# Patient Record
Sex: Female | Born: 1965 | Race: White | Hispanic: No | Marital: Married | Smoking: Current every day smoker
Health system: Southern US, Community
[De-identification: ages and names within clinical notes are randomized; demographics above are authoritative.]

## PROBLEM LIST (undated history)

## (undated) DIAGNOSIS — F419 Anxiety disorder, unspecified: Secondary | ICD-10-CM

## (undated) DIAGNOSIS — M329 Systemic lupus erythematosus, unspecified: Secondary | ICD-10-CM

## (undated) DIAGNOSIS — E785 Hyperlipidemia, unspecified: Secondary | ICD-10-CM

## (undated) DIAGNOSIS — E079 Disorder of thyroid, unspecified: Secondary | ICD-10-CM

## (undated) HISTORY — DX: Disorder of thyroid, unspecified: E07.9

## (undated) HISTORY — PX: ABLATION: SHX5711

## (undated) HISTORY — DX: Systemic lupus erythematosus, unspecified: M32.9

## (undated) HISTORY — DX: Anxiety disorder, unspecified: F41.9

## (undated) HISTORY — DX: Hyperlipidemia, unspecified: E78.5

---

## 1997-10-04 ENCOUNTER — Ambulatory Visit (HOSPITAL_COMMUNITY): Admission: RE | Admit: 1997-10-04 | Discharge: 1997-10-04 | Payer: Self-pay | Admitting: Obstetrics and Gynecology

## 1999-10-23 ENCOUNTER — Ambulatory Visit (HOSPITAL_COMMUNITY): Admission: RE | Admit: 1999-10-23 | Discharge: 1999-10-23 | Payer: Self-pay | Admitting: Family Medicine

## 1999-12-18 ENCOUNTER — Other Ambulatory Visit: Admission: RE | Admit: 1999-12-18 | Discharge: 1999-12-18 | Payer: Self-pay | Admitting: Obstetrics and Gynecology

## 2003-06-01 ENCOUNTER — Encounter: Admission: RE | Admit: 2003-06-01 | Discharge: 2003-06-01 | Payer: Self-pay | Admitting: Internal Medicine

## 2005-06-01 ENCOUNTER — Other Ambulatory Visit: Admission: RE | Admit: 2005-06-01 | Discharge: 2005-06-01 | Payer: Self-pay | Admitting: Obstetrics and Gynecology

## 2005-07-11 ENCOUNTER — Encounter: Admission: RE | Admit: 2005-07-11 | Discharge: 2005-07-11 | Payer: Self-pay | Admitting: Internal Medicine

## 2006-12-21 ENCOUNTER — Emergency Department (HOSPITAL_COMMUNITY): Admission: EM | Admit: 2006-12-21 | Discharge: 2006-12-21 | Payer: Self-pay | Admitting: Emergency Medicine

## 2007-09-26 ENCOUNTER — Other Ambulatory Visit: Admission: RE | Admit: 2007-09-26 | Discharge: 2007-09-26 | Payer: Self-pay | Admitting: Obstetrics and Gynecology

## 2007-10-08 ENCOUNTER — Encounter: Admission: RE | Admit: 2007-10-08 | Discharge: 2007-10-08 | Payer: Self-pay | Admitting: Obstetrics and Gynecology

## 2009-08-16 ENCOUNTER — Other Ambulatory Visit: Admission: RE | Admit: 2009-08-16 | Discharge: 2009-08-16 | Payer: Self-pay | Admitting: Obstetrics and Gynecology

## 2009-09-02 ENCOUNTER — Encounter: Admission: RE | Admit: 2009-09-02 | Discharge: 2009-09-02 | Payer: Self-pay | Admitting: Obstetrics and Gynecology

## 2011-01-10 ENCOUNTER — Other Ambulatory Visit (HOSPITAL_COMMUNITY)
Admission: RE | Admit: 2011-01-10 | Discharge: 2011-01-10 | Disposition: A | Discharge status: Home / Self Care | Payer: PRIVATE HEALTH INSURANCE | Source: Ambulatory Visit | Attending: Obstetrics and Gynecology | Admitting: Obstetrics and Gynecology

## 2011-01-10 ENCOUNTER — Other Ambulatory Visit: Payer: Self-pay | Admitting: Obstetrics and Gynecology

## 2011-01-10 DIAGNOSIS — Z01419 Encounter for gynecological examination (general) (routine) without abnormal findings: Secondary | ICD-10-CM | POA: Insufficient documentation

## 2011-04-18 LAB — URINALYSIS, ROUTINE W REFLEX MICROSCOPIC
Bilirubin Urine: NEGATIVE
Glucose, UA: NEGATIVE
Ketones, ur: NEGATIVE
Leukocytes, UA: NEGATIVE
Nitrite: NEGATIVE
Protein, ur: NEGATIVE
Specific Gravity, Urine: >1.03 — ABNORMAL HIGH
Urobilinogen, UA: 0.2
pH: 5.5

## 2011-04-18 LAB — CBC
HCT: 41.1
Hemoglobin: 14.6
MCHC: 35.4
MCV: 92.6
Platelets: 294
RBC: 4.43
RDW: 12.8
WBC: 13.6 — ABNORMAL HIGH

## 2011-04-18 LAB — COMPREHENSIVE METABOLIC PANEL
ALT: 24
AST: 29
Albumin: 3.8
Alkaline Phosphatase: 78
BUN: 16
CO2: 32
Calcium: 8.6
Chloride: 104
Creatinine, Ser: 0.66
GFR calc Af Amer: >60
GFR calc non Af Amer: >60
Glucose, Bld: 115 — ABNORMAL HIGH
Potassium: 3.3 — ABNORMAL LOW
Sodium: 136
Total Bilirubin: 0.5
Total Protein: 6.6

## 2011-04-18 LAB — DIFFERENTIAL
Basophils Absolute: 0
Basophils Relative: 0
Eosinophils Absolute: 0
Eosinophils Relative: 0
Lymphocytes Relative: 4 — ABNORMAL LOW
Lymphs Abs: 0.5 — ABNORMAL LOW
Monocytes Absolute: 0.2
Monocytes Relative: 2 — ABNORMAL LOW
Neutro Abs: 12.9 — ABNORMAL HIGH
Neutrophils Relative %: 95 — ABNORMAL HIGH

## 2011-04-18 LAB — LIPASE, BLOOD: Lipase: 12

## 2011-04-18 LAB — URINE CULTURE: Colony Count: 2000

## 2011-04-18 LAB — URINE MICROSCOPIC-ADD ON

## 2012-02-05 ENCOUNTER — Other Ambulatory Visit (HOSPITAL_COMMUNITY)
Admission: RE | Admit: 2012-02-05 | Discharge: 2012-02-05 | Disposition: A | Discharge status: Home / Self Care | Payer: PRIVATE HEALTH INSURANCE | Source: Ambulatory Visit | Attending: Obstetrics and Gynecology | Admitting: Obstetrics and Gynecology

## 2012-02-05 DIAGNOSIS — Z1151 Encounter for screening for human papillomavirus (HPV): Secondary | ICD-10-CM | POA: Insufficient documentation

## 2012-02-05 DIAGNOSIS — Z01419 Encounter for gynecological examination (general) (routine) without abnormal findings: Secondary | ICD-10-CM | POA: Insufficient documentation

## 2013-12-17 ENCOUNTER — Other Ambulatory Visit: Payer: Self-pay | Admitting: Internal Medicine

## 2013-12-17 DIAGNOSIS — Z1231 Encounter for screening mammogram for malignant neoplasm of breast: Secondary | ICD-10-CM

## 2013-12-21 ENCOUNTER — Ambulatory Visit: Payer: PRIVATE HEALTH INSURANCE

## 2014-01-19 ENCOUNTER — Ambulatory Visit
Admission: RE | Admit: 2014-01-19 | Discharge: 2014-01-19 | Disposition: A | Discharge status: Home / Self Care | Payer: BC Managed Care – PPO | Source: Ambulatory Visit | Attending: Internal Medicine | Admitting: Internal Medicine

## 2014-01-19 ENCOUNTER — Encounter (INDEPENDENT_AMBULATORY_CARE_PROVIDER_SITE_OTHER): Payer: Self-pay

## 2014-01-19 DIAGNOSIS — Z1231 Encounter for screening mammogram for malignant neoplasm of breast: Secondary | ICD-10-CM

## 2014-06-18 ENCOUNTER — Other Ambulatory Visit: Payer: Self-pay | Admitting: Internal Medicine

## 2014-06-18 DIAGNOSIS — R319 Hematuria, unspecified: Secondary | ICD-10-CM

## 2014-06-23 ENCOUNTER — Ambulatory Visit
Admission: RE | Admit: 2014-06-23 | Discharge: 2014-06-23 | Disposition: A | Discharge status: Home / Self Care | Payer: BC Managed Care – PPO | Source: Ambulatory Visit | Attending: Internal Medicine | Admitting: Internal Medicine

## 2014-06-23 DIAGNOSIS — R319 Hematuria, unspecified: Secondary | ICD-10-CM

## 2015-06-23 ENCOUNTER — Encounter: Payer: Self-pay | Admitting: Gastroenterology

## 2015-08-10 ENCOUNTER — Ambulatory Visit (AMBULATORY_SURGERY_CENTER): Payer: Self-pay | Admitting: *Deleted

## 2015-08-10 VITALS — Ht <= 58 in | Wt 135.0 lb

## 2015-08-10 DIAGNOSIS — Z1211 Encounter for screening for malignant neoplasm of colon: Secondary | ICD-10-CM

## 2015-08-10 MED ORDER — NA SULFATE-K SULFATE-MG SULF 17.5-3.13-1.6 GM/177ML PO SOLN: ORAL | Status: DC

## 2015-08-10 NOTE — Progress Notes (Signed)
Patient denies any allergies to eggs or soy. Patient denies any problems with anesthesia/sedation. Patient denies any oxygen use at home and does not take any diet/weight loss medications. EMMI education assisgned to patient on colonoscopy, this was explained and instructions given to patient. 

## 2015-08-24 ENCOUNTER — Encounter: Payer: Self-pay | Admitting: Gastroenterology

## 2015-08-24 ENCOUNTER — Ambulatory Visit (AMBULATORY_SURGERY_CENTER): Payer: BLUE CROSS/BLUE SHIELD | Admitting: Gastroenterology

## 2015-08-24 VITALS — BP 106/61 | HR 70 | Temp 99.1°F | Resp 13 | Ht <= 58 in | Wt 135.0 lb

## 2015-08-24 DIAGNOSIS — Z1211 Encounter for screening for malignant neoplasm of colon: Secondary | ICD-10-CM

## 2015-08-24 MED ORDER — SODIUM CHLORIDE 0.9 % IV SOLN: 500.0000 mL | INTRAVENOUS | Status: DC

## 2015-08-24 NOTE — Progress Notes (Signed)
Report to PACU, RN, vss, BBS= Clear.  

## 2015-08-24 NOTE — Op Note (Signed)
[facility]  [address] [place], [address]   COLONOSCOPY PROCEDURE REPORT  PATIENT: Angel Young  MR#: [id number] BIRTHDATE: 1965/08/01 , 49  yrs. old GENDER: female ENDOSCOPIST: Wilfrid Lund, MD REFERRED AY:[id number] Virgina Jock, M.D. PROCEDURE DATE:  08/24/2015 PROCEDURE:   Colonoscopy, screening First Screening Colonoscopy - Avg.  risk and is 50 yrs.  old or older Yes.  Prior Negative Screening - Now for repeat screening. N/A  History of Adenoma - Now for follow-up colonoscopy & has been > or = to 3 yrs.  N/A  Polyps removed today? No ASA CLASS:   Class II INDICATIONS:average risk patient for colon cancer. MEDICATIONS: Monitored anesthesia care and Propofol 200 mg IV  DESCRIPTION OF PROCEDURE:   After the risks benefits and alternatives of the procedure were thoroughly explained, informed consent was obtained.  The digital rectal exam revealed no abnormalities of the rectum.   The LB [id number]  endoscope was introduced through the anus and advanced to the cecum, which was identified by both the appendix and ileocecal valve. No adverse events experienced.   The quality of the prep was excellent. (Suprep was used)  The instrument was then slowly withdrawn as the colon was fully examined. Estimated blood loss is zero unless otherwise noted in this procedure report.      COLON FINDINGS: There was mild diverticulosis noted in the sigmoid colon and ascending colon.   Moderate sized Grade I hemorrhoids were found.  Retroflexed views revealed internal Grade I hemorrhoids. The time to cecum = 4.6 Withdrawal time = 10.1   The scope was withdrawn and the procedure completed. COMPLICATIONS: There were no immediate complications.  ENDOSCOPIC IMPRESSION: 1.   Mild diverticulosis was noted in the sigmoid colon and ascending colon 2.   Moderate sized Grade I hemorrhoids  RECOMMENDATIONS: Recall for screening colonoscopy in 10 years.  eSigned:  Wilfrid Lund, MD  08/24/2015 10:04 AM   cc:

## 2015-08-24 NOTE — Patient Instructions (Signed)
Discharge instructions given. Handouts on diverticulosis and hemorrhoids. Resume previous medications. YOU HAD AN ENDOSCOPIC PROCEDURE TODAY AT THE [facility]:   Refer to the procedure report that was given to you for any specific questions about what was found during the examination.  If the procedure report does not answer your questions, please call your gastroenterologist to clarify.  If you requested that your care partner not be given the details of your procedure findings, then the procedure report has been included in a sealed envelope for you to review at your convenience later.  YOU SHOULD EXPECT: Some feelings of bloating in the abdomen. Passage of more gas than usual.  Walking can help get rid of the air that was put into your GI tract during the procedure and reduce the bloating. If you had a lower endoscopy (such as a colonoscopy or flexible sigmoidoscopy) you may notice spotting of blood in your stool or on the toilet paper. If you underwent a bowel prep for your procedure, you may not have a normal bowel movement for a few days.  Please Note:  You might notice some irritation and congestion in your nose or some drainage.  This is from the oxygen used during your procedure.  There is no need for concern and it should clear up in a day or so.  SYMPTOMS TO REPORT IMMEDIATELY:   Following lower endoscopy (colonoscopy or flexible sigmoidoscopy):  Excessive amounts of blood in the stool  Significant tenderness or worsening of abdominal pains  Swelling of the abdomen that is new, acute  Fever of 100F or higher   For urgent or emergent issues, a gastroenterologist can be reached at any hour by calling [phone].   DIET: Your first meal following the procedure should be a small meal and then it is ok to progress to your normal diet. Heavy or fried foods are harder to digest and may make you feel nauseous or bloated.  Likewise, meals heavy in dairy and vegetables can  increase bloating.  Drink plenty of fluids but you should avoid alcoholic beverages for 24 hours.  ACTIVITY:  You should plan to take it easy for the rest of today and you should NOT DRIVE or use heavy machinery until tomorrow (because of the sedation medicines used during the test).    FOLLOW UP: Our staff will call the number listed on your records the next business day following your procedure to check on you and address any questions or concerns that you may have regarding the information given to you following your procedure. If we do not reach you, we will leave a message.  However, if you are feeling well and you are not experiencing any problems, there is no need to return our call.  We will assume that you have returned to your regular daily activities without incident.  If any biopsies were taken you will be contacted by phone or by letter within the next 1-3 weeks.  Please call us at [phone] if you have not heard about the biopsies in 3 weeks.    SIGNATURES/CONFIDENTIALITY: You and/or your care partner have signed paperwork which will be entered into your electronic medical record.  These signatures attest to the fact that that the information above on your After Visit Summary has been reviewed and is understood.  Full responsibility of the confidentiality of this discharge information lies with you and/or your care-partner. 

## 2015-08-25 ENCOUNTER — Telehealth: Payer: Self-pay | Admitting: *Deleted

## 2015-08-25 NOTE — Telephone Encounter (Signed)
  Follow up Call-  Call back number 08/24/2015  Post procedure Call Back phone  # [phone]  Permission to leave phone message Yes     Patient questions:  Do you have a fever, pain , or abdominal swelling? No. Pain Score  0 *  Have you tolerated food without any problems? Yes.    Have you been able to return to your normal activities? Yes.    Do you have any questions about your discharge instructions: Diet   No. Medications  No. Follow up visit  No.  Do you have questions or concerns about your Care? No.  Actions: * If pain score is 4 or above: No action needed, pain <4.

## 2016-01-26 ENCOUNTER — Other Ambulatory Visit (HOSPITAL_COMMUNITY)
Admission: RE | Admit: 2016-01-26 | Discharge: 2016-01-26 | Disposition: A | Discharge status: Home / Self Care | Payer: BLUE CROSS/BLUE SHIELD | Source: Ambulatory Visit | Attending: Obstetrics and Gynecology | Admitting: Obstetrics and Gynecology

## 2016-01-26 ENCOUNTER — Other Ambulatory Visit: Payer: Self-pay | Admitting: Obstetrics and Gynecology

## 2016-01-26 DIAGNOSIS — Z01419 Encounter for gynecological examination (general) (routine) without abnormal findings: Secondary | ICD-10-CM | POA: Insufficient documentation

## 2016-01-26 DIAGNOSIS — Z1151 Encounter for screening for human papillomavirus (HPV): Secondary | ICD-10-CM | POA: Insufficient documentation

## 2016-02-01 LAB — CYTOLOGY - PAP

## 2016-10-08 ENCOUNTER — Other Ambulatory Visit: Payer: Self-pay | Admitting: Internal Medicine

## 2016-10-08 DIAGNOSIS — Z1231 Encounter for screening mammogram for malignant neoplasm of breast: Secondary | ICD-10-CM

## 2016-11-09 ENCOUNTER — Ambulatory Visit
Admission: RE | Admit: 2016-11-09 | Discharge: 2016-11-09 | Disposition: A | Discharge status: Home / Self Care | Payer: BLUE CROSS/BLUE SHIELD | Source: Ambulatory Visit | Attending: Internal Medicine | Admitting: Internal Medicine

## 2016-11-09 DIAGNOSIS — Z1231 Encounter for screening mammogram for malignant neoplasm of breast: Secondary | ICD-10-CM

## 2017-08-15 DIAGNOSIS — M180 Bilateral primary osteoarthritis of first carpometacarpal joints: Secondary | ICD-10-CM | POA: Diagnosis not present

## 2017-08-15 DIAGNOSIS — L930 Discoid lupus erythematosus: Secondary | ICD-10-CM | POA: Diagnosis not present

## 2017-08-15 DIAGNOSIS — M79645 Pain in left finger(s): Secondary | ICD-10-CM | POA: Diagnosis not present

## 2017-08-21 DIAGNOSIS — R05 Cough: Secondary | ICD-10-CM | POA: Diagnosis not present

## 2017-08-21 DIAGNOSIS — R509 Fever, unspecified: Secondary | ICD-10-CM | POA: Diagnosis not present

## 2017-08-21 DIAGNOSIS — R52 Pain, unspecified: Secondary | ICD-10-CM | POA: Diagnosis not present

## 2017-09-11 DIAGNOSIS — L720 Epidermal cyst: Secondary | ICD-10-CM | POA: Diagnosis not present

## 2017-09-11 DIAGNOSIS — D225 Melanocytic nevi of trunk: Secondary | ICD-10-CM | POA: Diagnosis not present

## 2017-09-11 DIAGNOSIS — D2361 Other benign neoplasm of skin of right upper limb, including shoulder: Secondary | ICD-10-CM | POA: Diagnosis not present

## 2017-09-11 DIAGNOSIS — D482 Neoplasm of uncertain behavior of peripheral nerves and autonomic nervous system: Secondary | ICD-10-CM | POA: Diagnosis not present

## 2017-09-11 DIAGNOSIS — D229 Melanocytic nevi, unspecified: Secondary | ICD-10-CM | POA: Diagnosis not present

## 2017-10-03 DIAGNOSIS — L720 Epidermal cyst: Secondary | ICD-10-CM | POA: Diagnosis not present

## 2017-12-23 DIAGNOSIS — R7309 Other abnormal glucose: Secondary | ICD-10-CM | POA: Diagnosis not present

## 2017-12-23 DIAGNOSIS — E038 Other specified hypothyroidism: Secondary | ICD-10-CM | POA: Diagnosis not present

## 2017-12-23 DIAGNOSIS — I700 Atherosclerosis of aorta: Secondary | ICD-10-CM | POA: Diagnosis not present

## 2017-12-23 DIAGNOSIS — E7849 Other hyperlipidemia: Secondary | ICD-10-CM | POA: Diagnosis not present

## 2017-12-23 DIAGNOSIS — H01129 Discoid lupus erythematosus of unspecified eye, unspecified eyelid: Secondary | ICD-10-CM | POA: Diagnosis not present

## 2018-05-09 DIAGNOSIS — N951 Menopausal and female climacteric states: Secondary | ICD-10-CM | POA: Diagnosis not present

## 2018-05-09 DIAGNOSIS — Z01419 Encounter for gynecological examination (general) (routine) without abnormal findings: Secondary | ICD-10-CM | POA: Diagnosis not present

## 2018-06-27 DIAGNOSIS — E038 Other specified hypothyroidism: Secondary | ICD-10-CM | POA: Diagnosis not present

## 2018-06-27 DIAGNOSIS — R82998 Other abnormal findings in urine: Secondary | ICD-10-CM | POA: Diagnosis not present

## 2018-06-27 DIAGNOSIS — Z0000 Encounter for general adult medical examination without abnormal findings: Secondary | ICD-10-CM | POA: Diagnosis not present

## 2018-06-27 DIAGNOSIS — R7309 Other abnormal glucose: Secondary | ICD-10-CM | POA: Diagnosis not present

## 2018-07-04 ENCOUNTER — Other Ambulatory Visit: Payer: Self-pay | Admitting: Internal Medicine

## 2018-07-04 DIAGNOSIS — Z0000 Encounter for general adult medical examination without abnormal findings: Secondary | ICD-10-CM | POA: Diagnosis not present

## 2018-07-04 DIAGNOSIS — E785 Hyperlipidemia, unspecified: Secondary | ICD-10-CM

## 2018-07-04 DIAGNOSIS — H01129 Discoid lupus erythematosus of unspecified eye, unspecified eyelid: Secondary | ICD-10-CM | POA: Diagnosis not present

## 2018-07-04 DIAGNOSIS — Z1389 Encounter for screening for other disorder: Secondary | ICD-10-CM | POA: Diagnosis not present

## 2018-07-04 DIAGNOSIS — R7309 Other abnormal glucose: Secondary | ICD-10-CM | POA: Diagnosis not present

## 2018-07-04 DIAGNOSIS — I700 Atherosclerosis of aorta: Secondary | ICD-10-CM | POA: Diagnosis not present

## 2018-07-07 ENCOUNTER — Ambulatory Visit
Admission: RE | Admit: 2018-07-07 | Discharge: 2018-07-07 | Disposition: A | Discharge status: Home / Self Care | Payer: No Typology Code available for payment source | Source: Ambulatory Visit | Attending: Internal Medicine | Admitting: Internal Medicine

## 2018-07-07 DIAGNOSIS — E785 Hyperlipidemia, unspecified: Secondary | ICD-10-CM

## 2018-07-17 ENCOUNTER — Other Ambulatory Visit: Payer: Self-pay | Admitting: Internal Medicine

## 2018-07-17 DIAGNOSIS — Z1231 Encounter for screening mammogram for malignant neoplasm of breast: Secondary | ICD-10-CM

## 2018-08-20 ENCOUNTER — Ambulatory Visit
Admission: RE | Admit: 2018-08-20 | Discharge: 2018-08-20 | Disposition: A | Discharge status: Home / Self Care | Payer: BLUE CROSS/BLUE SHIELD | Source: Ambulatory Visit | Attending: Internal Medicine | Admitting: Internal Medicine

## 2018-08-20 DIAGNOSIS — Z1231 Encounter for screening mammogram for malignant neoplasm of breast: Secondary | ICD-10-CM

## 2018-08-22 ENCOUNTER — Other Ambulatory Visit: Payer: Self-pay | Admitting: Internal Medicine

## 2018-08-22 DIAGNOSIS — R928 Other abnormal and inconclusive findings on diagnostic imaging of breast: Secondary | ICD-10-CM

## 2018-08-27 ENCOUNTER — Ambulatory Visit
Admission: RE | Admit: 2018-08-27 | Discharge: 2018-08-27 | Disposition: A | Discharge status: Home / Self Care | Payer: Commercial Managed Care - PPO | Source: Ambulatory Visit | Attending: Internal Medicine | Admitting: Internal Medicine

## 2018-08-27 DIAGNOSIS — R928 Other abnormal and inconclusive findings on diagnostic imaging of breast: Secondary | ICD-10-CM

## 2018-08-27 DIAGNOSIS — N6489 Other specified disorders of breast: Secondary | ICD-10-CM | POA: Diagnosis not present

## 2018-08-27 DIAGNOSIS — R921 Mammographic calcification found on diagnostic imaging of breast: Secondary | ICD-10-CM | POA: Diagnosis not present

## 2019-06-03 ENCOUNTER — Other Ambulatory Visit (HOSPITAL_COMMUNITY)
Admission: RE | Admit: 2019-06-03 | Discharge: 2019-06-03 | Disposition: A | Discharge status: Home / Self Care | Payer: Commercial Managed Care - PPO | Source: Ambulatory Visit | Attending: Obstetrics and Gynecology | Admitting: Obstetrics and Gynecology

## 2019-06-03 ENCOUNTER — Encounter: Payer: Self-pay | Admitting: Obstetrics and Gynecology

## 2019-06-03 ENCOUNTER — Other Ambulatory Visit: Payer: Self-pay | Admitting: Obstetrics and Gynecology

## 2019-06-03 DIAGNOSIS — Z01419 Encounter for gynecological examination (general) (routine) without abnormal findings: Secondary | ICD-10-CM | POA: Diagnosis not present

## 2019-06-08 LAB — CYTOLOGY - PAP
Comment: NEGATIVE
Diagnosis: NEGATIVE
High risk HPV: NEGATIVE

## 2019-07-31 ENCOUNTER — Other Ambulatory Visit: Payer: Self-pay | Admitting: Internal Medicine

## 2019-07-31 DIAGNOSIS — Z1231 Encounter for screening mammogram for malignant neoplasm of breast: Secondary | ICD-10-CM

## 2019-09-07 ENCOUNTER — Ambulatory Visit
Admission: RE | Admit: 2019-09-07 | Discharge: 2019-09-07 | Disposition: A | Discharge status: Home / Self Care | Payer: Commercial Managed Care - PPO | Source: Ambulatory Visit | Attending: Internal Medicine | Admitting: Internal Medicine

## 2019-09-07 ENCOUNTER — Other Ambulatory Visit: Payer: Self-pay

## 2019-09-07 DIAGNOSIS — Z1231 Encounter for screening mammogram for malignant neoplasm of breast: Secondary | ICD-10-CM

## 2020-12-02 IMAGING — MG DIGITAL SCREENING BILAT W/ TOMO W/ CAD
8 series · 8 of 24 positions shown · non-contrast
Comparison: Previous exam(s).

CLINICAL DATA: Screening.

EXAM:
DIGITAL SCREENING BILATERAL MAMMOGRAM WITH TOMO AND CAD

[R MLO synth-2D]
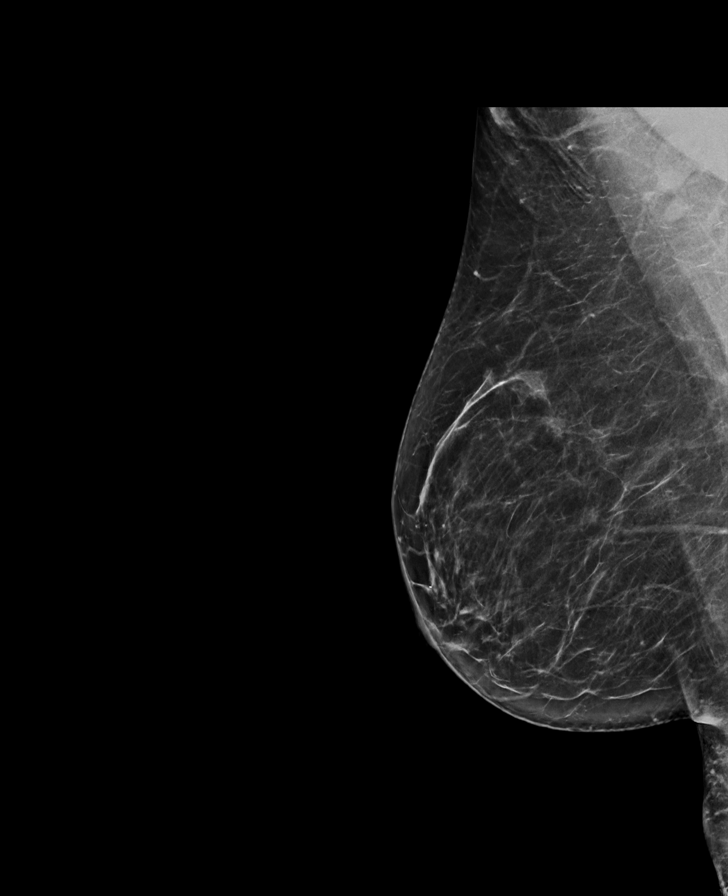

[L MLO synth-2D]
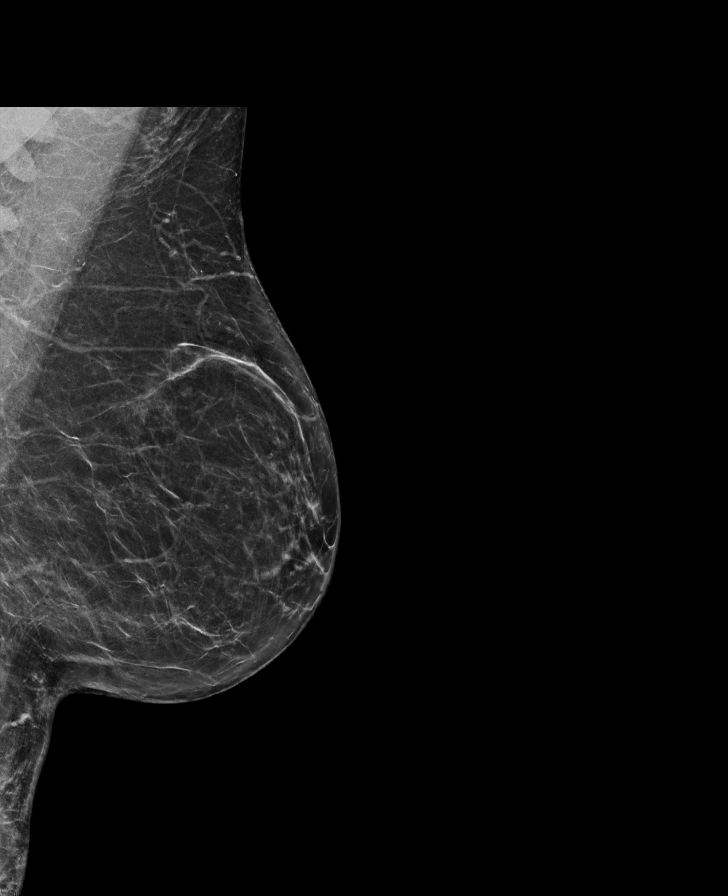

[R CC synth-2D]
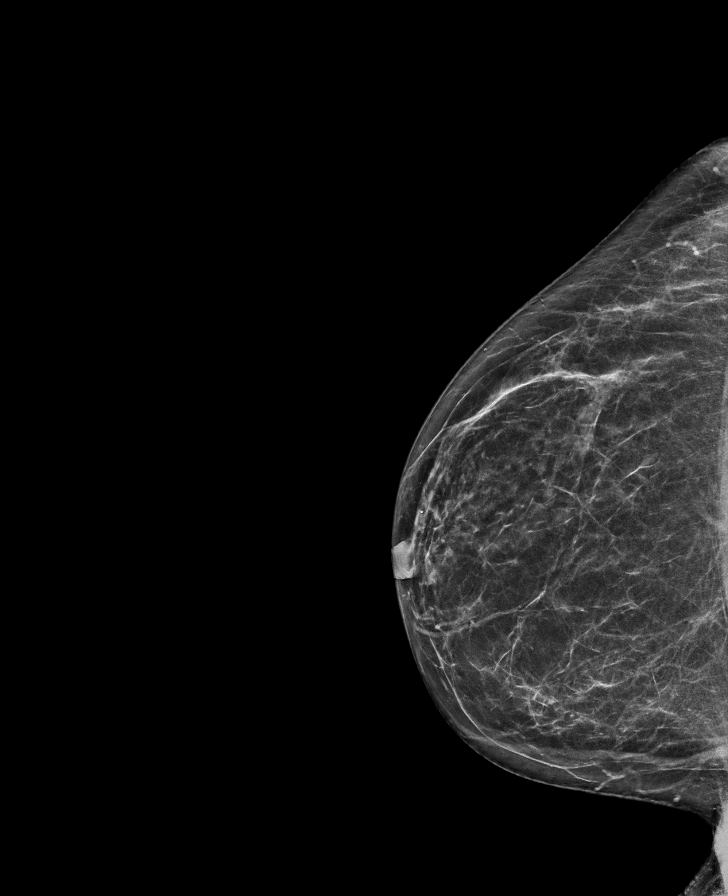

[L CC synth-2D]
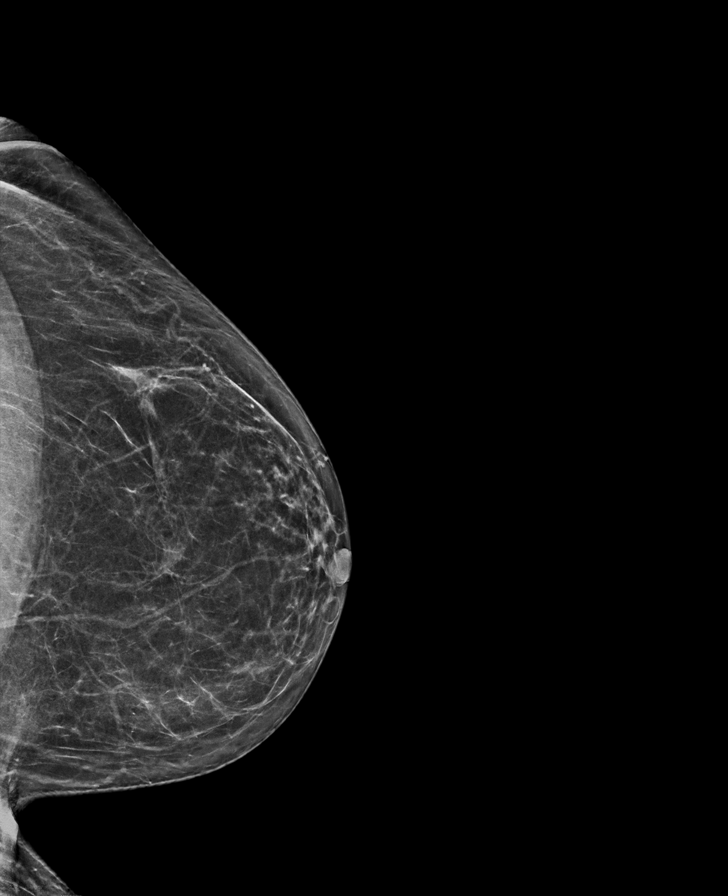

[R CC tomo · tomo slice 33/65.0]
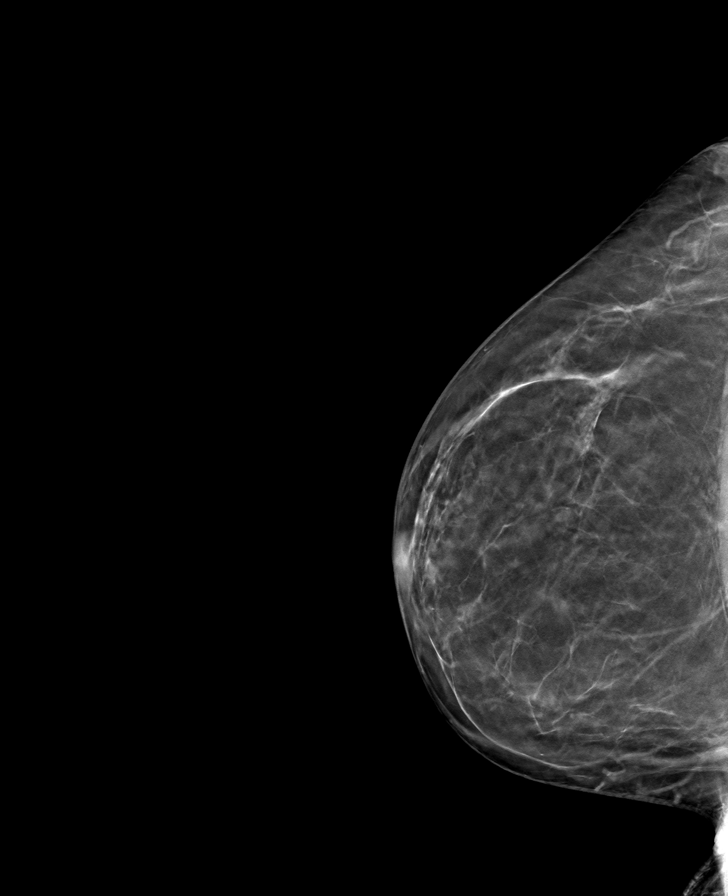

[L MLO tomo · tomo slice 36/71.0]
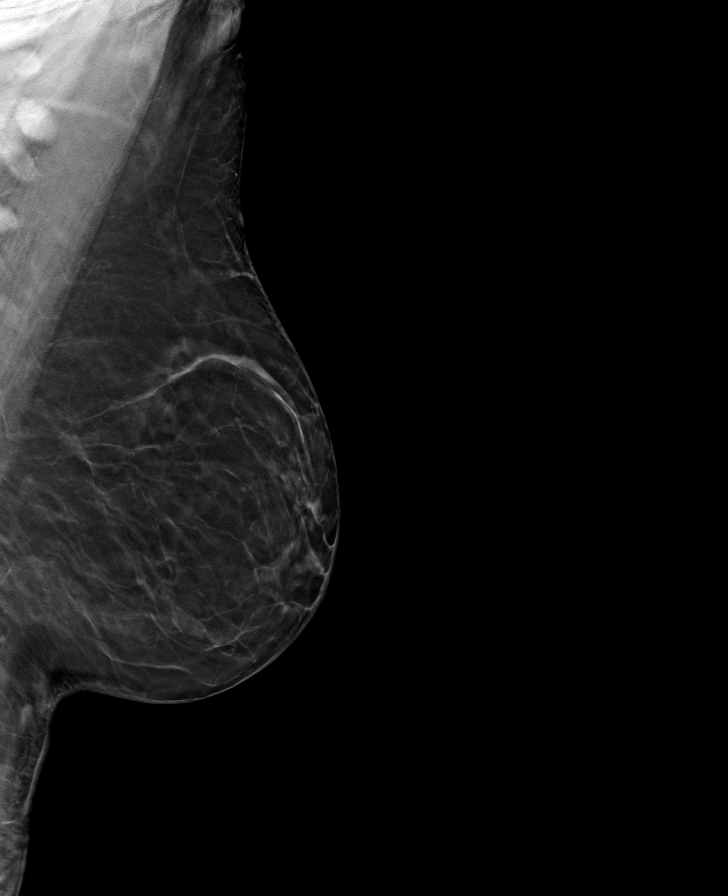

[L CC tomo · tomo slice 33/64.0]
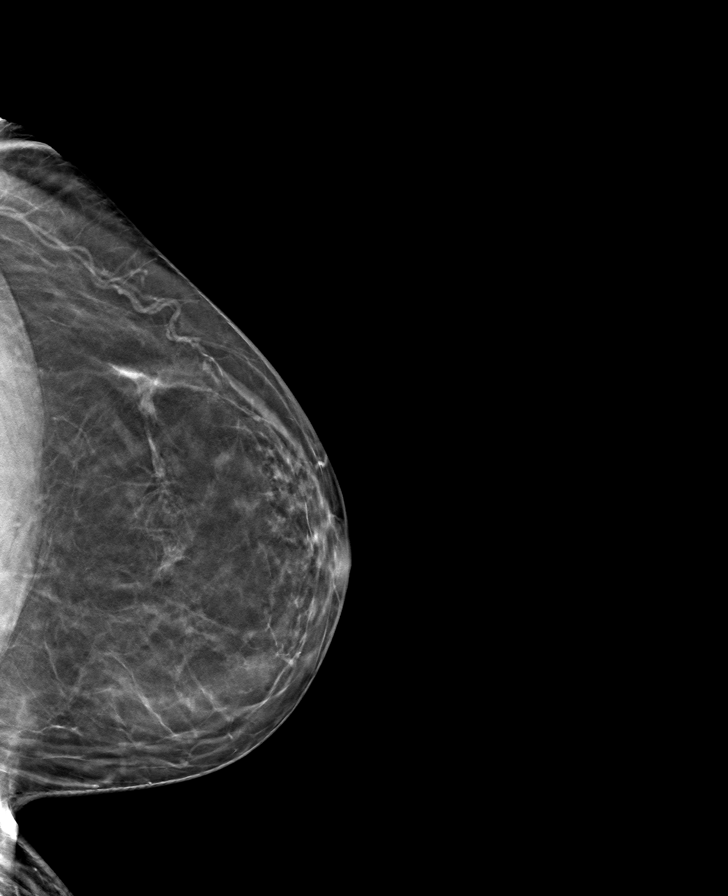

[R MLO tomo · tomo slice 39/76.0]
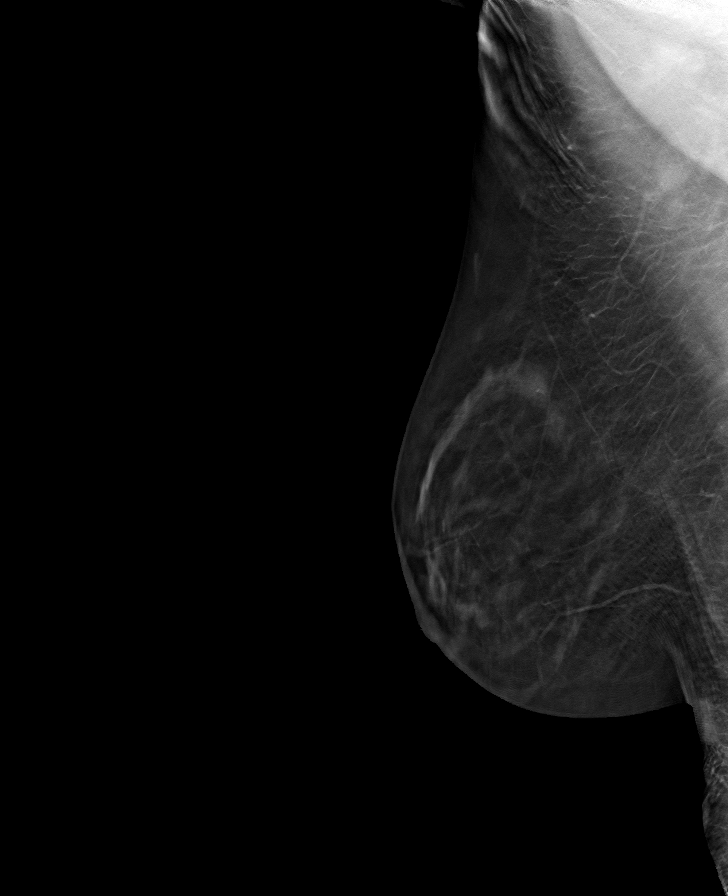

[8 of 24 positions shown; findings below may reference images not displayed]

ACR Breast Density Category b: There are scattered areas of
fibroglandular density.
FINDINGS: There are no findings suspicious for malignancy. Images were
processed with CAD.
IMPRESSION: No mammographic evidence of malignancy. A result letter of this
screening mammogram will be mailed directly to the patient.

RECOMMENDATION:
Screening mammogram in one year. (Code:[id number])

BI-RADS CATEGORY  1: Negative.

## 2021-10-25 ENCOUNTER — Ambulatory Visit (INDEPENDENT_AMBULATORY_CARE_PROVIDER_SITE_OTHER): Payer: 59 | Admitting: Gastroenterology

## 2021-10-25 ENCOUNTER — Encounter: Payer: Self-pay | Admitting: Gastroenterology

## 2021-10-25 ENCOUNTER — Other Ambulatory Visit (INDEPENDENT_AMBULATORY_CARE_PROVIDER_SITE_OTHER): Payer: 59

## 2021-10-25 VITALS — BP 162/82 | HR 82 | Ht <= 58 in | Wt 141.2 lb

## 2021-10-25 DIAGNOSIS — K5909 Other constipation: Secondary | ICD-10-CM

## 2021-10-25 DIAGNOSIS — R1013 Epigastric pain: Secondary | ICD-10-CM | POA: Diagnosis not present

## 2021-10-25 LAB — CBC WITH DIFFERENTIAL/PLATELET
Basophils Absolute: 0.1 10*3/uL (ref 0.0–0.1)
Basophils Relative: 1.1 % (ref 0.0–3.0)
Eosinophils Absolute: 0.1 10*3/uL (ref 0.0–0.7)
Eosinophils Relative: 1.3 % (ref 0.0–5.0)
HCT: 44.7 % (ref 36.0–46.0)
Hemoglobin: 14.9 g/dL (ref 12.0–15.0)
Lymphocytes Relative: 39.1 % (ref 12.0–46.0)
Lymphs Abs: 3.3 10*3/uL (ref 0.7–4.0)
MCHC: 33.3 g/dL (ref 30.0–36.0)
MCV: 95.9 fl (ref 78.0–100.0)
Monocytes Absolute: 0.5 10*3/uL (ref 0.1–1.0)
Monocytes Relative: 5.9 % (ref 3.0–12.0)
Neutro Abs: 4.4 10*3/uL (ref 1.4–7.7)
Neutrophils Relative %: 52.6 % (ref 43.0–77.0)
Platelets: 234 10*3/uL (ref 150.0–400.0)
RBC: 4.65 Mil/uL (ref 3.87–5.11)
RDW: 13.9 % (ref 11.5–15.5)
WBC: 8.4 10*3/uL (ref 4.0–10.5)

## 2021-10-25 LAB — COMPREHENSIVE METABOLIC PANEL
ALT: 19 U/L (ref 0–35)
AST: 19 U/L (ref 0–37)
Albumin: 4.5 g/dL (ref 3.5–5.2)
Alkaline Phosphatase: 114 U/L (ref 39–117)
BUN: 12 mg/dL (ref 6–23)
CO2: 30 mEq/L (ref 19–32)
Calcium: 9.6 mg/dL (ref 8.4–10.5)
Chloride: 103 mEq/L (ref 96–112)
Creatinine, Ser: 0.61 mg/dL (ref 0.40–1.20)
GFR: 99.86 mL/min (ref >60.00)
Glucose, Bld: 122 mg/dL — ABNORMAL HIGH (ref 70–99)
Potassium: 3.9 mEq/L (ref 3.5–5.1)
Sodium: 141 mEq/L (ref 135–145)
Total Bilirubin: 0.5 mg/dL (ref 0.2–1.2)
Total Protein: 7.1 g/dL (ref 6.0–8.3)

## 2021-10-25 LAB — H. PYLORI ANTIBODY, IGG: H Pylori IgG: NEGATIVE

## 2021-10-25 NOTE — Patient Instructions (Signed)
If you are age 57 or older, your body mass index should be between 23-30. Your Body mass index is 30.56 kg/m?Marland Kitchen If this is out of the aforementioned range listed, please consider follow up with your Primary Care Provider. ? ?If you are age 33 or younger, your body mass index should be between 19-25. Your Body mass index is 30.56 kg/m?Marland Kitchen If this is out of the aformentioned range listed, please consider follow up with your Primary Care Provider.  ? ?________________________________________________________ ? ?The [facility] providers would like to encourage you to use [facility] to communicate with providers for non-urgent requests or questions.  Due to long hold times on the telephone, sending your provider a message by [facility] may be a faster and more efficient way to get a response.  Please allow 48 business hours for a response.  Please remember that this is for non-urgent requests.  ?_______________________________________________________ ? ?Your provider has requested that you go to the basement level for lab work before leaving today. Press "B" on the elevator. The lab is located at the first door on the left as you exit the elevator. ? ?Due to recent changes in healthcare laws, you may see the results of your imaging and laboratory studies on MyChart before your provider has had a chance to review them.  We understand that in some cases there may be results that are confusing or concerning to you. Not all laboratory results come back in the same time frame and the provider may be waiting for multiple results in order to interpret others.  Please give [place] 48 hours in order for your provider to thoroughly review all the results before contacting the office for clarification of your results.  ? ?Please use 2 fleet enemas tonight ? ?You have been scheduled for a CT scan of the abdomen and pelvis at [facility] ([address]---this is in the same building as [employer]).  ? ?You are scheduled  on 11-03-2021 at 11:30am. You should arrive 15 minutes prior to your appointment time for registration. Please follow the written instructions below on the day of your exam: ? ?WARNING: IF YOU ARE ALLERGIC TO IODINE/X-RAY DYE, PLEASE NOTIFY RADIOLOGY IMMEDIATELY AT [phone]! YOU WILL BE GIVEN A 13 HOUR PREMEDICATION PREP. ? ?1) Do not eat or drink anything after 7:30am (4 hours prior to your test) ?2) You have been given 2 bottles of oral contrast to drink. The solution may taste better if refrigerated, but do NOT add ice or any other liquid to this solution. Shake well before drinking. ?  ? Drink 1 bottle of contrast @ 9:30am (2 hours prior to your exam) ? Drink 1 bottle of contrast @ 10:30am (1 hour prior to your exam) ? ?You may take any medications as prescribed with a small amount of water, if necessary. If you take any of the following medications: METFORMIN, GLUCOPHAGE, GLUCOVANCE, AVANDAMET, RIOMET, FORTAMET, ACTOPLUS MET, JANUMET, Terminous or METAGLIP, you MAY be asked to HOLD this medication 48 hours AFTER the exam. ? ?The purpose of you drinking the oral contrast is to aid in the visualization of your intestinal tract. The contrast solution may cause some diarrhea. Depending on your individual set of symptoms, you may also receive an intravenous injection of x-ray contrast/dye. Plan on being at [facility] for 30 minutes or longer, depending on the type of exam you are having performed. ? ?This test typically takes 30-45 minutes to complete. ? ?If you have any questions regarding  your exam or if you need to reschedule, you may call the CT department at [phone] between the hours of 8:00 am and 5:00 pm, Monday-Friday. ? ?________________________________________________________________________ ?It was a pleasure to see you today! ? ?Thank you for trusting me with your gastrointestinal care!   ? ? ?

## 2021-10-25 NOTE — Progress Notes (Signed)
? ? ?[facility] Consult Note: ? ?History: ?Angel Young ?10/25/2021 ? ?Referring provider: Shon Baton, MD ? ?Reason for consult/chief complaint: Constipation (Ongoing), Abdominal Pain (Epigastric pain for months, Right sided started 3 days ago), Bloated, and Colonoscopy (5 years ago) ? ? ?Subjective  ?HPI: ?I last saw Angel Young for a screening colonoscopy in February 2017, complete exam to the cecum with excellent prep.  No polyps, left-sided diverticulosis and internal hemorrhoids found. ? ?Angel Young was referred to [place] by primary care at her request for worsening abdominal pain and constipation.  She has had constipation for years, but it has worsened in the last few months.  She reports mentioning it to primary care at a telemedicine physical in February and was advised to take some Colace.  She had lab work done around that time and says her thyroid function was off so her Synthroid dose was decreased. ?She has had worsening epigastric pain with bloating and constipation and saw the PA about a week ago.  Plain x-ray was done and she was told that it showed a lot of retained stool.  (No primary care records available today).  No additional lab work done at that visit.  She took escalating doses of MiraLAX and then finally 2 L of GoLytely with little relief.  Earlier today she had a small bowel movement after taking 3 Colace yesterday.  She has had occasional hemorrhoidal bleeding over the years, none recently. ? ?Angel Young tells me her appetite has been good lately and she does not feel as if she has gained or lost any weight.  She is particularly bothered by this epigastric pain that has been episodic for months and at times even just the pressure of her bra on her lower chest/upper abdomen is bothersome. ?ROS: ? ?Review of Systems  ?Constitutional:  Negative for appetite change and unexpected weight change.  ?HENT:  Negative for mouth sores and voice change.   ?Eyes:  Negative for pain and  redness.  ?Respiratory:  Negative for cough and shortness of breath.   ?Cardiovascular:  Negative for chest pain and palpitations.  ?Genitourinary:  Negative for dysuria and hematuria.  ?Musculoskeletal:  Negative for arthralgias and myalgias.  ?Skin:  Negative for pallor and rash.  ?Neurological:  Negative for weakness and headaches.  ?Hematological:  Negative for adenopathy.  ?Psychiatric/Behavioral:    ?     Mood stable  ? ? ?Past Medical History: ?Past Medical History:  ?Diagnosis Date  ? Anxiety   ? Lupus ([place])   ? skin  ? Thyroid disease   ? ? ? ?Past Surgical History: ?Past Surgical History:  ?Procedure Laterality Date  ? ABLATION    ? cervical ablation  ? CESAREAN SECTION    ? x2  ? ? ? ?Family History: ?Family History  ?Problem Relation Age of Onset  ? Alzheimer's disease Mother   ? Lung cancer Father   ? Alzheimer's disease Paternal Grandmother   ? Colon cancer Neg Hx   ? ? ?Social History: ?Social History  ? ?Socioeconomic History  ? Marital status: Married  ?  Spouse name: Not on file  ? Number of children: Not on file  ? Years of education: Not on file  ? Highest education level: Not on file  ?Occupational History  ? Not on file  ?Tobacco Use  ? Smoking status: Every Day  ?  Packs/day: 0.50  ?  Types: Cigarettes  ? Smokeless tobacco: Never  ?Vaping Use  ? Vaping Use: Never used  ?  Substance and Sexual Activity  ? Alcohol use: Yes  ?  Comment: occasional   ? Drug use: No  ? Sexual activity: Not on file  ?Other Topics Concern  ? Not on file  ?Social History Narrative  ? Not on file  ? ?Social Determinants of Health  ? ?Financial Resource Strain: Not on file  ?Food Insecurity: Not on file  ?Transportation Needs: Not on file  ?Physical Activity: Not on file  ?Stress: Not on file  ?Social Connections: Not on file  ? ? ?Allergies: ?No Known Allergies ? ?Outpatient Meds: ?Current Outpatient Medications  ?Medication Sig Dispense Refill  ? albuterol (VENTOLIN HFA) 108 (90 Base) MCG/ACT inhaler as needed.    ?  Ascorbic Acid (VITAMIN C) 1000 MG tablet Take 1,000 mg by mouth daily.    ? aspirin 81 MG tablet Take 81 mg by mouth daily.    ? Black Elderberry 50 MG/5ML SYRP 2,000 mg daily.    ? Calcium Carbonate-Vitamin D (OYSTER SHELL CALCIUM/D) 500-5 MG-MCG TABS Take 600 mg by mouth daily.    ? COCONUT OIL PO Take 1,000 mg by mouth daily.    ? Coenzyme Q10 (CO Q-10) 100 MG CAPS Take by mouth daily.    ? Cyanocobalamin (VITAMIN B 12 PO) Take 1 tablet by mouth daily.    ? DULoxetine (CYMBALTA) 30 MG capsule Take 30 mg by mouth daily.    ? levothyroxine (SYNTHROID) 112 MCG tablet Take 100 mcg by mouth daily before breakfast. 1 tablet daily except Wed 1/2 tab    ? Maca Root (FEMMENESSENCE MACAHARMONY) 500 MG CAPS 2 capsules daily.    ? Melatonin 3 MG CAPS Take 3 mg by mouth at bedtime.    ? meloxicam (MOBIC) 15 MG tablet Take 15 mg by mouth daily.    ? Omega-3 Fatty Acids (OMEGAPURE 780 EC PO) Take 3 capsules by mouth.    ? Potassium 99 MG TABS Take 1 tablet by mouth daily.    ? simvastatin (ZOCOR) 80 MG tablet Take 80 mg by mouth daily.    ? Zinc 50 MG CAPS Take 50 mg by mouth daily.    ? ?No current facility-administered medications for this visit.  ? ? ? ? ?___________________________________________________________________ ?Objective  ? ?Exam: ? ?BP (!) 162/82   Pulse 82   Ht '4\' 9"'$  (1.448 m)   Wt 141 lb 3.2 oz (64 kg)   SpO2 93%   BMI 30.56 kg/m?  ?Wt Readings from Last 3 Encounters:  ?10/25/21 141 lb 3.2 oz (64 kg)  ?08/24/15 135 lb (61.2 kg)  ?08/10/15 135 lb (61.2 kg)  ? ? ?General: Well-appearing,, good muscle mass, well-hydrated, raspy vocal quality (which she says has been longstanding from smoking and perhaps a bit worse after respiratory infection 2 months ago) ?Eyes: sclera anicteric, no redness ?ENT: oral mucosa moist without lesions, no cervical or supraclavicular lymphadenopathy ?CV: RRR without murmur, S1/S2, no JVD, no peripheral edema ?Resp: clear to auscultation bilaterally, normal RR and effort  noted ?GI: soft, no tenderness, with active bowel sounds. No guarding or palpable organomegaly noted.  No bruit or distention ?Skin; warm and dry, no rash or jaundice noted ?Neuro: awake, alert and oriented x 3. Normal gross motor function and fluent speech ?Rectal: Normal external exam.  No fissure tenderness or palpable internal lesion.  Soft light brown heme-negative stool. ?Labs: ? ?No recent data for review ?Assessment: ?Encounter Diagnoses  ?Name Primary?  ? Epigastric pain Yes  ? Chronic constipation   ? ? ?  While she does have worsening constipation lately, the epigastric location of the pain is atypical for constipation as a cause.  I am concerned about peptic ulcer, H. pylori, gallstones and even considerations a neoplasm given her smoking history. ?It is surprising and somewhat worrisome that she did not get much relief of constipation with everything she has tried. ?Plan: ? ?CT abdomen and pelvis with oral and IV contrast.  We will try to get this done within a week. ? ?2 fleets enema this evening. ? ?Labs today: H. pylori IgG antibody, CMP, CBC ? ?If above is unrevealing, schedule upper endoscopy. ? ?Thank you for the courtesy of this consult.  Please call me with any questions or concerns. ? ?Nelida Meuse III ? ?CC: Referring provider noted above ? ?

## 2021-11-03 ENCOUNTER — Ambulatory Visit (INDEPENDENT_AMBULATORY_CARE_PROVIDER_SITE_OTHER)
Admission: RE | Admit: 2021-11-03 | Discharge: 2021-11-03 | Disposition: A | Discharge status: Home / Self Care | Payer: 59 | Source: Ambulatory Visit | Attending: Gastroenterology | Admitting: Gastroenterology

## 2021-11-03 DIAGNOSIS — K5909 Other constipation: Secondary | ICD-10-CM | POA: Diagnosis not present

## 2021-11-03 DIAGNOSIS — R1013 Epigastric pain: Secondary | ICD-10-CM

## 2021-11-03 MED ORDER — IOHEXOL 300 MG/ML  SOLN
100.0000 mL | Freq: Once | INTRAMUSCULAR | Status: AC | PRN
  Administered 2021-11-03: 100 mL via INTRAVENOUS

## 2021-11-06 ENCOUNTER — Other Ambulatory Visit: Payer: Self-pay

## 2021-11-06 DIAGNOSIS — R1013 Epigastric pain: Secondary | ICD-10-CM

## 2021-11-06 MED ORDER — LINACLOTIDE 72 MCG PO CAPS: 72.0000 ug | ORAL_CAPSULE | Freq: Every day | ORAL | 0 refills | Status: DC

## 2021-11-06 MED ORDER — LINACLOTIDE 145 MCG PO CAPS: 145.0000 ug | ORAL_CAPSULE | Freq: Every day | ORAL | 0 refills | Status: DC

## 2021-11-29 ENCOUNTER — Other Ambulatory Visit: Payer: Self-pay | Admitting: Gastroenterology

## 2021-11-29 ENCOUNTER — Encounter: Payer: Self-pay | Admitting: Gastroenterology

## 2021-11-29 ENCOUNTER — Ambulatory Visit (AMBULATORY_SURGERY_CENTER): Payer: 59 | Admitting: Gastroenterology

## 2021-11-29 VITALS — BP 137/71 | HR 68 | Temp 97.8°F | Resp 18 | Ht <= 58 in | Wt 141.0 lb

## 2021-11-29 DIAGNOSIS — K2970 Gastritis, unspecified, without bleeding: Secondary | ICD-10-CM

## 2021-11-29 DIAGNOSIS — R1013 Epigastric pain: Secondary | ICD-10-CM

## 2021-11-29 DIAGNOSIS — K2950 Unspecified chronic gastritis without bleeding: Secondary | ICD-10-CM | POA: Diagnosis not present

## 2021-11-29 DIAGNOSIS — K257 Chronic gastric ulcer without hemorrhage or perforation: Secondary | ICD-10-CM | POA: Diagnosis not present

## 2021-11-29 DIAGNOSIS — K253 Acute gastric ulcer without hemorrhage or perforation: Secondary | ICD-10-CM

## 2021-11-29 MED ORDER — SODIUM CHLORIDE 0.9 % IV SOLN: 500.0000 mL | Freq: Once | INTRAVENOUS | Status: DC

## 2021-11-29 MED ORDER — OMEPRAZOLE 40 MG PO CPDR: 40.0000 mg | DELAYED_RELEASE_CAPSULE | Freq: Two times a day (BID) | ORAL | 0 refills | Status: AC

## 2021-11-29 MED ORDER — LINACLOTIDE 145 MCG PO CAPS: 145.0000 ug | ORAL_CAPSULE | Freq: Every day | ORAL | 4 refills | Status: DC

## 2021-11-29 NOTE — Progress Notes (Signed)
Pt non-responsive, VVS, Report to RN  °

## 2021-11-29 NOTE — Patient Instructions (Addendum)
Resume previous diet and continue present medications except for Aspirin (discontinue aspirin) Await pathology results. Pick up prescriptions for Linzess 145 microgram tablet once a day and Omeprazole 40 mg tablet twice a day for 8 weeks from [facility] off of [address] in [place].    YOU HAD AN ENDOSCOPIC PROCEDURE TODAY AT [facility]:   Refer to the procedure report that was given to you for any specific questions about what was found during the examination.  If the procedure report does not answer your questions, please call your gastroenterologist to clarify.  If you requested that your care partner not be given the details of your procedure findings, then the procedure report has been included in a sealed envelope for you to review at your convenience later.  YOU SHOULD EXPECT: Some feelings of bloating in the abdomen. Passage of more gas than usual.  Walking can help get rid of the air that was put into your GI tract during the procedure and reduce the bloating. If you had a lower endoscopy (such as a colonoscopy or flexible sigmoidoscopy) you may notice spotting of blood in your stool or on the toilet paper. If you underwent a bowel prep for your procedure, you may not have a normal bowel movement for a few days.  Please Note:  You might notice some irritation and congestion in your nose or some drainage.  This is from the oxygen used during your procedure.  There is no need for concern and it should clear up in a day or so.  SYMPTOMS TO REPORT IMMEDIATELY:  Following upper endoscopy (EGD)  Vomiting of blood or coffee ground material  New chest pain or pain under the shoulder blades  Painful or persistently difficult swallowing  New shortness of breath  Fever of 100F or higher  Black, tarry-looking stools  For urgent or emergent issues, a gastroenterologist can be reached at any hour by calling [phone]. Do not use MyChart messaging for urgent  concerns.    DIET:  We do recommend a small meal at first, but then you may proceed to your regular diet.  Drink plenty of fluids but you should avoid alcoholic beverages for 24 hours.  ACTIVITY:  You should plan to take it easy for the rest of today and you should NOT DRIVE or use heavy machinery until tomorrow (because of the sedation medicines used during the test).    FOLLOW UP: Our staff will call the number listed on your records 48-72 hours following your procedure to check on you and address any questions or concerns that you may have regarding the information given to you following your procedure. If we do not reach you, we will leave a message.  We will attempt to reach you two times.  During this call, we will ask if you have developed any symptoms of COVID 19. If you develop any symptoms (ie: fever, flu-like symptoms, shortness of breath, cough etc.) before then, please call [phone].  If you test positive for Covid 19 in the 2 weeks post procedure, please call and report this information to [place].    If any biopsies were taken you will be contacted by phone or by letter within the next 1-3 weeks.  Please call us at [phone] if you have not heard about the biopsies in 3 weeks.    SIGNATURES/CONFIDENTIALITY: You and/or your care partner have signed paperwork which will be entered into your electronic medical record.  These signatures attest to  the fact that that the information above on your After Visit Summary has been reviewed and is understood.  Full responsibility of the confidentiality of this discharge information lies with you and/or your care-partner.

## 2021-11-29 NOTE — Progress Notes (Signed)
Called to room to assist during endoscopic procedure.  Patient ID and intended procedure confirmed with present staff. Received instructions for my participation in the procedure from the performing physician.  

## 2021-11-29 NOTE — Progress Notes (Signed)
History and Physical:  This patient presents for endoscopic testing for: Encounter Diagnosis  Name Primary?   Epigastric pain Yes    Clinical details in my office consult note of 10/25/2021.  Patient had little relief of constipation from CT scan oral contrast, thus was started on samples of Linzess.  CT abdomen and pelvis unrevealing for cause of the abdominal pain.  Subsequent CBC, CMP only notable for glucose 122, H. pylori IgG antibody negative.  Patient is otherwise without complaints or active issues today. She reports that Linzess 145 mcg once daily has significantly improved her constipation, and most days she has 1 formed BM with significant relief of the abdominal discomfort.  Past Medical History: Past Medical History:  Diagnosis Date   Anxiety    Hyperlipidemia    Lupus ([place])    skin   Thyroid disease      Past Surgical History: Past Surgical History:  Procedure Laterality Date   ABLATION     cervical ablation   CESAREAN SECTION     x2    Allergies: No Known Allergies  Outpatient Meds: Current Outpatient Medications  Medication Sig Dispense Refill   Ascorbic Acid (VITAMIN C) 1000 MG tablet Take 1,000 mg by mouth daily.     aspirin 81 MG tablet Take 81 mg by mouth daily.     Black Elderberry 50 MG/5ML SYRP 2,000 mg daily.     Calcium Carbonate-Vitamin D (OYSTER SHELL CALCIUM/D) 500-5 MG-MCG TABS Take 600 mg by mouth daily.     COCONUT OIL PO Take 1,000 mg by mouth daily.     Coenzyme Q10 (CO Q-10) 100 MG CAPS Take by mouth daily.     Cyanocobalamin (VITAMIN B 12 PO) Take 1 tablet by mouth daily.     DULoxetine (CYMBALTA) 30 MG capsule Take 30 mg by mouth daily.     levothyroxine (SYNTHROID) 112 MCG tablet Take 100 mcg by mouth daily before breakfast. 1 tablet daily except Wed 1/2 tab     linaclotide (LINZESS) 145 MCG CAPS capsule Take 1 capsule (145 mcg total) by mouth daily before breakfast. 12 capsule 0   Melatonin 3 MG CAPS Take 3 mg by mouth at bedtime.      meloxicam (MOBIC) 15 MG tablet Take 15 mg by mouth daily.     Potassium 99 MG TABS Take 1 tablet by mouth daily.     simvastatin (ZOCOR) 80 MG tablet Take 80 mg by mouth daily.     Zinc 50 MG CAPS Take 50 mg by mouth daily.     albuterol (VENTOLIN HFA) 108 (90 Base) MCG/ACT inhaler as needed.     linaclotide (LINZESS) 72 MCG capsule Take 1 capsule (72 mcg total) by mouth daily before breakfast for 12 days. 12 capsule 0   Maca Root (FEMMENESSENCE MACAHARMONY) 500 MG CAPS 2 capsules daily. (Patient not taking: Reported on 11/29/2021)     Omega-3 Fatty Acids (OMEGAPURE 780 EC PO) Take 3 capsules by mouth.     Current Facility-Administered Medications  Medication Dose Route Frequency Provider Last Rate Last Admin   0.9 %  sodium chloride infusion  500 mL Intravenous Once Doran Stabler, MD          ___________________________________________________________________ Objective   Exam:  BP (!) 153/75   Pulse 75   Temp 97.8 F (36.6 C)   Ht '4\' 9"'$  (1.448 m)   Wt 141 lb (64 kg)   SpO2 96%   BMI 30.51 kg/m   CV: RRR  without murmur, S1/S2 Resp: clear to auscultation bilaterally, normal RR and effort noted GI: soft, no tenderness, with active bowel sounds.   Assessment: Encounter Diagnosis  Name Primary?   Epigastric pain Yes     Plan: EGD  The benefits and risks of the planned procedure were described in detail with the patient or (when appropriate) their health care proxy.  Risks were outlined as including, but not limited to, bleeding, infection, perforation, adverse medication reaction leading to cardiac or pulmonary decompensation, pancreatitis (if ERCP).  The limitation of incomplete mucosal visualization was also discussed.  No guarantees or warranties were given.    The patient is appropriate for an endoscopic procedure in the ambulatory setting.   - Wilfrid Lund, MD

## 2021-11-29 NOTE — Op Note (Signed)
[place] Patient Name: Angel Young Procedure Date: 11/29/2021 9:40 AM MRN: [id number] Endoscopist: Mallie Mussel L. Loletha Carrow , MD Age: 56 Referring MD:  Date of Birth: 1966/02/23 Gender: Female Account #: [id number] Procedure:                Upper GI endoscopy Indications:              Epigastric abdominal pain                           Clinical details in office consult note.                           patient reports pain much improved since relief of                            constipation with Linzess 145 mcg daily                           normal CBC, CMP, neg H pylori IgG                           on 81 mg aspirin daily Medicines:                Monitored Anesthesia Care Procedure:                Pre-Anesthesia Assessment:                           - Prior to the procedure, a History and Physical                            was performed, and patient medications and                            allergies were reviewed. The patient's tolerance of                            previous anesthesia was also reviewed. The risks                            and benefits of the procedure and the sedation                            options and risks were discussed with the patient.                            All questions were answered, and informed consent                            was obtained. Prior Anticoagulants: The patient has                            taken no previous anticoagulant or antiplatelet  agents. ASA Grade Assessment: II - A patient with                            mild systemic disease. After reviewing the risks                            and benefits, the patient was deemed in                            satisfactory condition to undergo the procedure.                           After obtaining informed consent, the endoscope was                            passed under direct vision. Throughout the                            procedure, the  patient's blood pressure, pulse, and                            oxygen saturations were monitored continuously. The                            GIF [id number] #[id number] was introduced through the                            mouth, and advanced to the second part of duodenum.                            The upper GI endoscopy was accomplished without                            difficulty. The patient tolerated the procedure                            well. Scope In: Scope Out: Findings:                 The esophagus was normal.                           Two non-bleeding cratered gastric ulcers with no                            stigmata of bleeding were found at the pylorus. The                            largest lesion was 8 mm in largest dimension.                            Several biopsies were obtained on the greater                            curvature  of the gastric body, on the lesser                            curvature of the gastric body, on the greater                            curvature of the gastric antrum and on the lesser                            curvature of the gastric antrum with cold forceps                            for histology.                           The exam of the stomach was otherwise normal.                           The cardia and gastric fundus were normal on                            retroflexion.                           The examined duodenum was normal. Complications:            No immediate complications. Estimated Blood Loss:     Estimated blood loss was minimal. Impression:               - Normal esophagus.                           - Non-bleeding gastric ulcers with no stigmata of                            bleeding.                           - Normal examined duodenum.                           - Several biopsies were obtained on the greater                            curvature of the gastric body, on the lesser                            curvature  of the gastric body, on the greater                            curvature of the gastric antrum and on the lesser                            curvature of the gastric antrum.  While an important finding, these ulcers appear                            unlikely to be the cause of abdominal pain (see                            clinical history above). Recommendation:           - Patient has a contact number available for                            emergencies. The signs and symptoms of potential                            delayed complications were discussed with the                            patient. Return to normal activities tomorrow.                            Written discharge instructions were provided to the                            patient.                           - Resume previous diet.                           - Continue present medications except aspirin                            (discontinue aspirin).                           - Await pathology results.                           Linzess 145 microgram tablet once daily. Disp #30,                            RF 4                           Omeprazole 40 miligram tablet twice daily for 8                            weeks. Disp #60 RF 0 Damien Batty L. Loletha Carrow, MD 11/29/2021 10:10:43 AM This report has been signed electronically.

## 2021-11-30 ENCOUNTER — Telehealth: Payer: Self-pay

## 2021-11-30 ENCOUNTER — Telehealth: Payer: Self-pay | Admitting: Gastroenterology

## 2021-11-30 NOTE — Telephone Encounter (Signed)
  Follow up Call-     11/29/2021    9:08 AM  Call back number  Post procedure Call Back phone  # [phone]  Permission to leave phone message Yes     Patient questions:  Do you have a fever, pain , or abdominal swelling? No. Pain Score  0 *  Have you tolerated food without any problems? Yes.    Have you been able to return to your normal activities? Yes.    Do you have any questions about your discharge instructions: Diet   No. Medications  No. Follow up visit  No.  Do you have questions or concerns about your Care? No.  Actions: * If pain score is 4 or above: No action needed, pain <4.

## 2021-11-30 NOTE — Telephone Encounter (Signed)
PA request has been sent to the pre-cert team.

## 2021-11-30 NOTE — Telephone Encounter (Signed)
Inbound call from patient in regards to medication prescribed. Patient states pharmacy is requesting more information on the prescription for priolosec. And also patient states her insurance is requesting a for to be filled out for the medication linzess. Please give patient a call back to advise.  Thank You

## 2021-12-01 ENCOUNTER — Encounter: Payer: Self-pay | Admitting: Gastroenterology

## 2021-12-01 ENCOUNTER — Other Ambulatory Visit (HOSPITAL_COMMUNITY): Payer: Self-pay

## 2021-12-04 NOTE — Telephone Encounter (Signed)
Patient calling to follow up previous message regarding PA Linzess. Stated she has been out of the medication since Weds.

## 2021-12-05 ENCOUNTER — Other Ambulatory Visit (HOSPITAL_COMMUNITY): Payer: Self-pay

## 2021-12-06 ENCOUNTER — Other Ambulatory Visit (HOSPITAL_COMMUNITY): Payer: Self-pay

## 2021-12-12 MED ORDER — LUBIPROSTONE 8 MCG PO CAPS: 8.0000 ug | ORAL_CAPSULE | Freq: Two times a day (BID) | ORAL | 3 refills | Status: DC

## 2021-12-12 NOTE — Telephone Encounter (Signed)
Yes, we can change to Amitiza 8 mcg twice daily  HD

## 2021-12-12 NOTE — Telephone Encounter (Signed)
Dr Loletha Carrow can we change the Linzess to Amitiza?

## 2021-12-12 NOTE — Telephone Encounter (Signed)
Rx for Amitiza 45mg, one twice a day has been sent to the pharmacy

## 2021-12-12 NOTE — Addendum Note (Signed)
Addended by: Elias Else on: 12/12/2021 02:35 PM   Modules accepted: Orders

## 2021-12-19 ENCOUNTER — Ambulatory Visit (INDEPENDENT_AMBULATORY_CARE_PROVIDER_SITE_OTHER): Payer: 59 | Admitting: Physician Assistant

## 2021-12-19 DIAGNOSIS — L281 Prurigo nodularis: Secondary | ICD-10-CM

## 2021-12-19 MED ORDER — DOXYCYCLINE HYCLATE 100 MG PO CAPS: ORAL_CAPSULE | ORAL | 0 refills | Status: DC

## 2021-12-19 MED ORDER — DOXEPIN HCL 3 MG PO TABS: ORAL_TABLET | ORAL | 1 refills | Status: AC

## 2021-12-19 NOTE — Patient Instructions (Signed)
Dupilumab Injection What is this medication? DUPILUMAB (doo PIL ue mab) treats some types of skin conditions, such as eczema. It may also be used to treat conditions that cause inflammation in the sinuses and esophagus. It can be used to prevent the symptoms of asthma. It works by decreasing inflammation. Do not use it to treat a sudden asthma attack. This medicine may be used for other purposes; ask your health care provider or pharmacist if you have questions. COMMON BRAND NAME(S): DUPIXENT What should I tell my care team before I take this medication? They need to know if you have any of these conditions: Asthma Eye disease Parasitic (helminth) infection An unusual or allergic reaction to dupilumab, other medicines, foods, dyes, or preservatives Pregnant or trying to get pregnant Breast-feeding How should I use this medication? This medication is injected under the skin. You will be taught how to prepare and give it. Take it as directed on the prescription label. Keep taking it unless your care team tells you to stop. If you use a pen, be sure to take off the outer needle cover before using the dose. It is important that you put your used needles and syringes in a special sharps container. Do not put them in a trash can. If you do not have a sharps container, call your pharmacist or care team to get one. A patient package insert for the product will be given with each prescription and refill. Be sure to read this information carefully each time. The sheet may change often. This medication comes with INSTRUCTIONS FOR USE. Ask your pharmacist for directions on how to use this medication. Read the information carefully. Talk to your care team if you have questions. Talk to your care team about the use of this medication in children. While this medication may be prescribed for children as young as 6 years for selected conditions, precautions do apply. Overdosage: If you think you have taken too  much of this medicine contact a poison control center or emergency room at once. NOTE: This medicine is only for you. Do not share this medicine with others. What if I miss a dose? It is important not to miss any doses. Talk to your care team about what to do if you miss a dose. What may interact with this medication? Interactions are not expected. This list may not describe all possible interactions. Give your health care provider a list of all the medicines, herbs, non-prescription drugs, or dietary supplements you use. Also tell them if you smoke, drink alcohol, or use illegal drugs. Some items may interact with your medicine. What should I watch for while using this medication? Visit your care team for regular checks on your progress. Tell your care team if your symptoms do not start to get better or if they get worse. This medication can decrease the response to a vaccine. If you need to get vaccinated, tell your care team if you have received this medication. Talk to your care team to see if a different vaccination schedule is needed. If you take this medication for asthma, you and your care team should develop an Asthma Action Plan that is just for you. Be sure to know what to do if you are in the yellow (asthma is getting worse) or red (medical alert) zones. This medication is not used to treat sudden breathing problems. What side effects may I notice from receiving this medication? Side effects that you should report to your care team as soon  as possible: Allergic reactions--skin rash, itching, hives, swelling of the face, lips, tongue, or throat Eye pain, redness, irritation, or discharge with blurry or decreased vision Unusual weakness or fatigue, fever, headache, skin rash, muscle or joint pain, loss of appetite, pain, tingling, or numbness in the hands or feet Side effects that usually do not require medical attention (report these to your care team if they continue or are  bothersome): Cold sores Dry eyes Joint pain Pain, redness, or irritation at injection site Sore throat Trouble sleeping This list may not describe all possible side effects. Call your doctor for medical advice about side effects. You may report side effects to FDA at 1-800-FDA-1088. Where should I keep my medication? Keep out of the reach of children and pets. Store in the refrigerator at 2 to 8 degrees C (36 to 46 degrees F). Do not freeze. Do not shake. Keep this medication in the original packaging until you are ready to take it. Protect from light. Get rid of any unused medication after the expiration date. This medication may be stored at room temperature up to 25 degrees C (77 degrees F) for up to 14 days. Keep this medication in the original packaging. Protect from light. If it is stored at room temperature, get rid of any unused medication after 14 days or after it expires, whichever is first. To get rid of medications that are no longer needed or have expired: Take the medication to a medication take-back program. Check with your pharmacy or law enforcement to find a location. If you cannot return the medication, ask your pharmacist or care team how to get rid of this medication safely. NOTE: This sheet is a summary. It may not cover all possible information. If you have questions about this medicine, talk to your doctor, pharmacist, or health care provider.  2023 Elsevier/Gold Standard (2021-04-13 00:00:00)

## 2021-12-21 ENCOUNTER — Encounter: Payer: Self-pay | Admitting: Physician Assistant

## 2021-12-21 NOTE — Progress Notes (Signed)
   New Patient   Subjective  Angel Young is a 56 y.o. female who presents for the following: New Patient (Initial Visit) (Patient here today for lupus flare up on her right arms per patient she's been scratching and picking. Per patient she does have current increase of stress personal and work related. Per patient her PCP prescribed Desonide Cream and she uses Benadryl spray. ).She is having a hard time with    The following portions of the chart were reviewed this encounter and updated as appropriate:  Tobacco  Allergies  Meds  Problems  Med Hx  Surg Hx  Fam Hx      Objective  Well appearing patient in no apparent distress; mood and affect are within normal limits.  All skin waist up examined.  Left Forearm - Anterior, Right Forearm - Anterior Numerous excoriated nodules   Assessment & Plan  Prurigo nodularis Left Forearm - Anterior; Right Forearm - Anterior  If no better in 2 weeks after doxycycline- Dupixent new start  doxycycline (VIBRAMYCIN) 100 MG capsule - Left Forearm - Anterior, Right Forearm - Anterior Take one po bid x 10 days  Doxepin HCl 3 MG TABS - Left Forearm - Anterior, Right Forearm - Anterior Take one po bid     I, Kemon Devincenzi, PA-C, have reviewed all documentation's for this visit.  The documentation on 12/21/21 for the exam, diagnosis, procedures and orders are all accurate and complete.

## 2021-12-25 ENCOUNTER — Encounter: Payer: Self-pay | Admitting: Physician Assistant

## 2021-12-27 ENCOUNTER — Ambulatory Visit (INDEPENDENT_AMBULATORY_CARE_PROVIDER_SITE_OTHER): Payer: 59

## 2021-12-27 DIAGNOSIS — L281 Prurigo nodularis: Secondary | ICD-10-CM | POA: Diagnosis not present

## 2021-12-27 MED ORDER — DUPILUMAB 300 MG/2ML ~~LOC~~ SOAJ
600.0000 mg | Freq: Once | SUBCUTANEOUS | Status: AC
  Administered 2021-12-27: 600 mg via SUBCUTANEOUS

## 2021-12-27 NOTE — Progress Notes (Signed)
Patient here today for nurse visit for [place] training. Patient gave herself both injections in her right lower abdomen and left lower abdomen. Patient tolerated well. Lot # [id number] Exp 10/30/23. (Sample)

## 2022-01-05 ENCOUNTER — Other Ambulatory Visit (HOSPITAL_COMMUNITY): Payer: Self-pay

## 2022-01-05 ENCOUNTER — Telehealth: Payer: Self-pay | Admitting: Gastroenterology

## 2022-01-05 NOTE — Telephone Encounter (Signed)
Prior Auth for OMEPRAZOLE '40mg'$  discontinued.   Previous policy has ended, am unable to find current coverage for this patient. Will start a new prior authorization if needed once insurance is updated.  Stephaine H, CPhT

## 2022-01-22 ENCOUNTER — Telehealth: Payer: Self-pay | Admitting: Physician Assistant

## 2022-01-22 NOTE — Telephone Encounter (Signed)
Angel R. (?) with [facility] left message on office voice mail that he was calling to get information on the patient's [place] prescription.  [phone]  Extension # [phone].

## 2022-01-22 NOTE — Telephone Encounter (Signed)
Phone call to [facility] to see what we can help them with. Per Arbie Cookey they are needing the patient's prescription BIN and other information for the patient's prescriptions. I informed Arbie Cookey that the requested information isn't on the patient's insurance card. Per Arbie Cookey they will reach back out to the patient to see if she has a prescription drug card.

## 2022-01-30 ENCOUNTER — Telehealth: Payer: Self-pay | Admitting: *Deleted

## 2022-01-30 MED ORDER — DUPIXENT 300 MG/2ML ~~LOC~~ SOAJ: 300.0000 mg | SUBCUTANEOUS | 6 refills | Status: DC

## 2022-01-30 NOTE — Telephone Encounter (Signed)
[facility] fax stating patient needs refill on dupixent- sent to patient pharmacy.

## 2022-02-05 ENCOUNTER — Encounter: Payer: Self-pay | Admitting: Physician Assistant

## 2022-02-05 ENCOUNTER — Telehealth: Payer: Self-pay | Admitting: *Deleted

## 2022-02-05 NOTE — Telephone Encounter (Signed)
Clinical notes sent to [facility] via portal for dupxent.

## 2022-02-07 NOTE — Telephone Encounter (Signed)
Re faxed records and BSA for [facility]

## 2022-02-21 ENCOUNTER — Ambulatory Visit: Payer: 59 | Admitting: Physician Assistant

## 2022-04-24 ENCOUNTER — Other Ambulatory Visit: Payer: Self-pay

## 2022-04-24 ENCOUNTER — Encounter: Payer: Self-pay | Admitting: Gastroenterology

## 2022-04-24 MED ORDER — LUBIPROSTONE 8 MCG PO CAPS: 8.0000 ug | ORAL_CAPSULE | Freq: Two times a day (BID) | ORAL | 3 refills | Status: DC

## 2022-08-14 ENCOUNTER — Encounter: Payer: Self-pay | Admitting: Gastroenterology

## 2022-08-14 MED ORDER — LUBIPROSTONE 8 MCG PO CAPS: 8.0000 ug | ORAL_CAPSULE | Freq: Two times a day (BID) | ORAL | 2 refills | Status: DC

## 2022-10-15 DIAGNOSIS — E039 Hypothyroidism, unspecified: Secondary | ICD-10-CM | POA: Diagnosis not present

## 2022-10-15 DIAGNOSIS — R739 Hyperglycemia, unspecified: Secondary | ICD-10-CM | POA: Diagnosis not present

## 2022-10-15 DIAGNOSIS — E785 Hyperlipidemia, unspecified: Secondary | ICD-10-CM | POA: Diagnosis not present

## 2022-10-15 DIAGNOSIS — R7989 Other specified abnormal findings of blood chemistry: Secondary | ICD-10-CM | POA: Diagnosis not present

## 2022-10-22 DIAGNOSIS — F329 Major depressive disorder, single episode, unspecified: Secondary | ICD-10-CM | POA: Diagnosis not present

## 2022-10-22 DIAGNOSIS — Z1389 Encounter for screening for other disorder: Secondary | ICD-10-CM | POA: Diagnosis not present

## 2022-10-22 DIAGNOSIS — E785 Hyperlipidemia, unspecified: Secondary | ICD-10-CM | POA: Diagnosis not present

## 2022-10-22 DIAGNOSIS — Z0000 Encounter for general adult medical examination without abnormal findings: Secondary | ICD-10-CM | POA: Diagnosis not present

## 2022-10-22 DIAGNOSIS — E669 Obesity, unspecified: Secondary | ICD-10-CM | POA: Diagnosis not present

## 2022-10-22 DIAGNOSIS — K5904 Chronic idiopathic constipation: Secondary | ICD-10-CM | POA: Diagnosis not present

## 2022-10-22 DIAGNOSIS — E039 Hypothyroidism, unspecified: Secondary | ICD-10-CM | POA: Diagnosis not present

## 2022-10-22 DIAGNOSIS — N390 Urinary tract infection, site not specified: Secondary | ICD-10-CM | POA: Diagnosis not present

## 2022-10-22 DIAGNOSIS — Z1331 Encounter for screening for depression: Secondary | ICD-10-CM | POA: Diagnosis not present

## 2022-10-22 DIAGNOSIS — M1990 Unspecified osteoarthritis, unspecified site: Secondary | ICD-10-CM | POA: Diagnosis not present

## 2022-10-22 DIAGNOSIS — I2584 Coronary atherosclerosis due to calcified coronary lesion: Secondary | ICD-10-CM | POA: Diagnosis not present

## 2022-10-22 DIAGNOSIS — I700 Atherosclerosis of aorta: Secondary | ICD-10-CM | POA: Diagnosis not present

## 2022-10-22 DIAGNOSIS — R200 Anesthesia of skin: Secondary | ICD-10-CM | POA: Diagnosis not present

## 2022-10-22 DIAGNOSIS — H01129 Discoid lupus erythematosus of unspecified eye, unspecified eyelid: Secondary | ICD-10-CM | POA: Diagnosis not present

## 2022-10-22 DIAGNOSIS — E119 Type 2 diabetes mellitus without complications: Secondary | ICD-10-CM | POA: Diagnosis not present

## 2022-10-31 ENCOUNTER — Telehealth: Payer: Self-pay | Admitting: Pharmacy Technician

## 2022-10-31 NOTE — Telephone Encounter (Signed)
Patient Advocate Encounter  Received notification from [facility] that prior authorization for LUBIPROSTONE is required.   PA submitted on 5.1.24 Key [id number] Status is pending

## 2022-11-01 ENCOUNTER — Other Ambulatory Visit (HOSPITAL_COMMUNITY): Payer: Self-pay

## 2022-11-01 NOTE — Telephone Encounter (Signed)
Patient Advocate Encounter  Prior Authorization for LUBIPROSTONE has been approved with [employer].    PA# [id number] Effective dates: 5.1.24 through 5.1.27  Per [employer] test claim, UNAVAILABLE DUE TO FILL AT [facility] 4.29.24

## 2022-11-30 ENCOUNTER — Other Ambulatory Visit: Payer: Self-pay | Admitting: Gastroenterology

## 2022-12-27 ENCOUNTER — Ambulatory Visit
Admission: RE | Admit: 2022-12-27 | Discharge: 2022-12-27 | Disposition: A | Discharge status: Home / Self Care | Payer: 59 | Source: Ambulatory Visit | Attending: Adult Health | Admitting: Adult Health

## 2022-12-27 ENCOUNTER — Other Ambulatory Visit: Payer: Self-pay | Admitting: Adult Health

## 2022-12-27 ENCOUNTER — Encounter: Payer: Self-pay | Admitting: Adult Health

## 2022-12-27 DIAGNOSIS — K5730 Diverticulosis of large intestine without perforation or abscess without bleeding: Secondary | ICD-10-CM | POA: Diagnosis not present

## 2022-12-27 DIAGNOSIS — R1031 Right lower quadrant pain: Secondary | ICD-10-CM | POA: Diagnosis not present

## 2022-12-27 DIAGNOSIS — R109 Unspecified abdominal pain: Secondary | ICD-10-CM

## 2022-12-27 DIAGNOSIS — M545 Low back pain, unspecified: Secondary | ICD-10-CM | POA: Diagnosis not present

## 2022-12-27 DIAGNOSIS — R319 Hematuria, unspecified: Secondary | ICD-10-CM | POA: Diagnosis not present

## 2022-12-27 DIAGNOSIS — R3129 Other microscopic hematuria: Secondary | ICD-10-CM | POA: Diagnosis not present

## 2022-12-27 DIAGNOSIS — N390 Urinary tract infection, site not specified: Secondary | ICD-10-CM | POA: Diagnosis not present

## 2023-01-28 ENCOUNTER — Other Ambulatory Visit: Payer: Self-pay | Admitting: Gastroenterology

## 2023-01-28 NOTE — Telephone Encounter (Signed)
Must keep follow up for refills

## 2023-02-04 DIAGNOSIS — E669 Obesity, unspecified: Secondary | ICD-10-CM | POA: Diagnosis not present

## 2023-02-04 DIAGNOSIS — R030 Elevated blood-pressure reading, without diagnosis of hypertension: Secondary | ICD-10-CM | POA: Diagnosis not present

## 2023-02-04 DIAGNOSIS — E119 Type 2 diabetes mellitus without complications: Secondary | ICD-10-CM | POA: Diagnosis not present

## 2023-02-04 DIAGNOSIS — H01129 Discoid lupus erythematosus of unspecified eye, unspecified eyelid: Secondary | ICD-10-CM | POA: Diagnosis not present

## 2023-02-04 DIAGNOSIS — R109 Unspecified abdominal pain: Secondary | ICD-10-CM | POA: Diagnosis not present

## 2023-02-04 DIAGNOSIS — E039 Hypothyroidism, unspecified: Secondary | ICD-10-CM | POA: Diagnosis not present

## 2023-02-04 DIAGNOSIS — I2584 Coronary atherosclerosis due to calcified coronary lesion: Secondary | ICD-10-CM | POA: Diagnosis not present

## 2023-02-04 DIAGNOSIS — F329 Major depressive disorder, single episode, unspecified: Secondary | ICD-10-CM | POA: Diagnosis not present

## 2023-02-04 DIAGNOSIS — F17210 Nicotine dependence, cigarettes, uncomplicated: Secondary | ICD-10-CM | POA: Diagnosis not present

## 2023-02-04 DIAGNOSIS — R739 Hyperglycemia, unspecified: Secondary | ICD-10-CM | POA: Diagnosis not present

## 2023-02-04 DIAGNOSIS — R202 Paresthesia of skin: Secondary | ICD-10-CM | POA: Diagnosis not present

## 2023-02-04 DIAGNOSIS — F418 Other specified anxiety disorders: Secondary | ICD-10-CM | POA: Diagnosis not present

## 2023-02-20 ENCOUNTER — Other Ambulatory Visit: Payer: Self-pay | Admitting: Gastroenterology

## 2023-02-20 NOTE — Telephone Encounter (Signed)
Last seen in office April 2023  This Amitiza can be refilled at the current dose for 90-day prescription as requested, but also needs a clinic appointment with me in the next 90 days for ongoing management of this medicine.  HD

## 2023-02-27 ENCOUNTER — Ambulatory Visit: Payer: 59 | Admitting: Gastroenterology

## 2023-02-27 ENCOUNTER — Encounter: Payer: Self-pay | Admitting: Gastroenterology

## 2023-02-27 VITALS — BP 110/72 | HR 91 | Ht <= 58 in | Wt 133.2 lb

## 2023-02-27 DIAGNOSIS — Z8711 Personal history of peptic ulcer disease: Secondary | ICD-10-CM | POA: Diagnosis not present

## 2023-02-27 DIAGNOSIS — K5909 Other constipation: Secondary | ICD-10-CM | POA: Diagnosis not present

## 2023-02-27 NOTE — Progress Notes (Deleted)
[facility] Progress Note  Chief Complaint: ***  Subjective  History: Bennita follows up today for her chronic constipation and history of gastric ulcer.  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  Biopsies negative for H. pylori, and discontinuation of aspirin was recommended. At that point, her constipation was well-controlled on Linzess 145 mcg daily.  It was later changed to Amitiza 8 mcg daily for insurance reasons, and has been refilled since then. ***  ROS: Cardiovascular:  no chest pain Respiratory: no dyspnea  The patient's Past Medical, Family and Social History were reviewed and are on file in the EMR.  Objective:  Med list reviewed  Current Outpatient Medications:    albuterol (VENTOLIN HFA) 108 (90 Base) MCG/ACT inhaler, as needed., Disp: , Rfl:    Ascorbic Acid (VITAMIN C) 1000 MG tablet, Take 1,000 mg by mouth daily., Disp: , Rfl:    aspirin 81 MG tablet, Take 81 mg by mouth daily., Disp: , Rfl:    Black Elderberry 50 MG/5ML SYRP, 2,000 mg daily., Disp: , Rfl:    Calcium Carbonate-Vitamin D (OYSTER SHELL CALCIUM/D) 500-5 MG-MCG TABS, Take 600 mg by mouth daily., Disp: , Rfl:    COCONUT OIL PO, Take 1,000 mg by mouth daily., Disp: , Rfl:    Coenzyme Q10 (CO Q-10) 100 MG CAPS, Take by mouth daily., Disp: , Rfl:    Cyanocobalamin (VITAMIN B 12 PO), Take 1 tablet by mouth daily., Disp: , Rfl:    Doxepin HCl 3 MG TABS, Take one po bid, Disp: 60 tablet, Rfl: 1   doxycycline (VIBRAMYCIN) 100 MG capsule, Take one po bid x 10 days, Disp: 20 capsule, Rfl: 0   DULoxetine (CYMBALTA) 30 MG capsule, Take 30 mg by mouth daily., Disp: , Rfl:    Dupilumab (DUPIXENT) 300 MG/2ML SOPN, Inject 300 mg into the skin every 14 (fourteen) days. Starting at day 15 for maintenance., Disp: 4 mL, Rfl: 6   levothyroxine (SYNTHROID) 100 MCG tablet, Take 100 mcg by mouth daily., Disp: , Rfl:    lubiprostone  (AMITIZA) 8 MCG capsule, TAKE 1 CAPSULE BY MOUTH TWICE A DAY WITH MEALS, Disp: 180 capsule, Rfl: 1   Maca Root (FEMMENESSENCE MACAHARMONY) 500 MG CAPS, 2 capsules daily., Disp: , Rfl:    Melatonin 3 MG CAPS, Take 3 mg by mouth at bedtime., Disp: , Rfl:    meloxicam (MOBIC) 15 MG tablet, Take 15 mg by mouth daily., Disp: , Rfl:    Omega-3 Fatty Acids (OMEGAPURE 780 EC PO), Take 3 capsules by mouth., Disp: , Rfl:    omeprazole (PRILOSEC) 40 MG capsule, Take 1 capsule (40 mg total) by mouth 2 (two) times daily., Disp: 120 capsule, Rfl: 0   Potassium 99 MG TABS, Take 1 tablet by mouth daily., Disp: , Rfl:    simvastatin (ZOCOR) 80 MG tablet, Take 80 mg by mouth daily., Disp: , Rfl:    Zinc 50 MG CAPS, Take 50 mg by mouth daily., Disp: , Rfl:    Vital signs in last 24 hrs: There were no vitals filed for this visit. Wt Readings from Last 3 Encounters:  11/29/21 141 lb (64 kg)  10/25/21 141 lb 3.2 oz (64 kg)  08/24/15 135 lb (61.2 kg)    Physical Exam  *** HEENT: sclera anicteric, oral mucosa moist without lesions Neck: supple, no thyromegaly, JVD or lymphadenopathy Cardiac: ***,  no peripheral edema Pulm:  clear to auscultation bilaterally, normal RR and effort noted Abdomen: soft, *** tenderness, with active bowel sounds. No guarding or palpable hepatosplenomegaly. Skin; warm and dry, no jaundice or rash  Labs:   ___________________________________________ Radiologic studies:   ____________________________________________ Other:   _____________________________________________ Assessment & Plan  Assessment: No diagnosis found.    Plan:   *** minutes were spent on this encounter (including chart review, history/exam, counseling/coordination of care, and documentation) > 50% of that time was spent on counseling and coordination of care.   Charlie Pitter III

## 2023-02-27 NOTE — Patient Instructions (Signed)
_______________________________________________________  If your blood pressure at your visit was 140/90 or greater, please contact your primary care physician to follow up on this.  _______________________________________________________  If you are age 58 or older, your body mass index should be between 23-30. Your Body mass index is 28.84 kg/m. If this is out of the aforementioned range listed, please consider follow up with your Primary Care Provider.  If you are age 34 or younger, your body mass index should be between 19-25. Your Body mass index is 28.84 kg/m. If this is out of the aformentioned range listed, please consider follow up with your Primary Care Provider.   ________________________________________________________  [facility] providers would like to encourage you to use [facility] to communicate with providers for non-urgent requests or questions.  Due to long hold times on the telephone, sending your provider a message by [facility] may be a faster and more efficient way to get a response.  Please allow 48 business hours for a response.  Please remember that this is for non-urgent requests.  _______________________________________________________  Follow up in one year   It was a pleasure to see you today!  Thank you for trusting me with your gastrointestinal care!

## 2023-02-27 NOTE — Progress Notes (Signed)
[facility] Progress Note  Chief Complaint:  Chief Complaint  Patient presents with   Follow-up    Doing good overall. Havent used prilosec in months     Subjective  History: Angel Young follows up today for her chronic constipation and history of gastric ulcer.  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  Biopsies negative for H. pylori, and discontinuation of aspirin was recommended. At that point, her constipation was well-controlled on Linzess 145 mcg daily.  It was later changed to Amitiza 8 mcg daily for insurance reasons, and has been refilled since then. ________________  Today, we reviewed her latest EGD and discussed her diagnosis of ulcers. She was on Prilosec for several months to heal her ulcers. She reports that she is no longer on Aspirin or Prilosec.   She is currently taking and has good tolerance of Amitiza 800 mg twice daily. She would occasionally not have a BM for 2 days but overall has regular BM with the medication. She was previously on Linzess but was having issues with her insurance.    Patient denies diarrhea, constipation, nausea, blood in stool, black stool, vomiting, abdominal pain, bloating, unintentional weight loss, reflux, dysphagia.  ROS: Review of Systems  Constitutional:  Negative for appetite change and fever.  HENT:  Negative for trouble swallowing.   Respiratory:  Negative for cough and shortness of breath.   Cardiovascular:  Negative for chest pain.  Gastrointestinal:  Negative for abdominal distention, abdominal pain, anal bleeding, blood in stool, constipation, diarrhea, nausea, rectal pain and vomiting.  Genitourinary:  Negative for dysuria.  Musculoskeletal:  Negative for back pain.  Skin:  Negative for rash.  Neurological:  Negative for weakness.  All other systems reviewed and are negative.    The patient's Past Medical, Family and Social History were  reviewed and are on file in the EMR.  Objective:  Med list reviewed  Current Outpatient Medications:    Coenzyme Q10 (CO Q-10) 100 MG CAPS, Take by mouth daily., Disp: , Rfl:    Cyanocobalamin (VITAMIN B 12 PO), Take 1 tablet by mouth daily., Disp: , Rfl:    Doxepin HCl 3 MG TABS, Take one po bid, Disp: 60 tablet, Rfl: 1   DULoxetine (CYMBALTA) 30 MG capsule, Take 30 mg by mouth daily., Disp: , Rfl:    levothyroxine (SYNTHROID) 100 MCG tablet, Take 100 mcg by mouth daily., Disp: , Rfl:    lubiprostone (AMITIZA) 8 MCG capsule, TAKE 1 CAPSULE BY MOUTH TWICE A DAY WITH MEALS, Disp: 180 capsule, Rfl: 1   Melatonin 3 MG CAPS, Take 3 mg by mouth at bedtime., Disp: , Rfl:    Moringa Oleifera (MORINGA PO), Take 1 capsule by mouth 2 (two) times daily. Pure Vida, Disp: , Rfl:    simvastatin (ZOCOR) 80 MG tablet, Take 80 mg by mouth daily., Disp: , Rfl:    Vitamin D-Vitamin K (VITAMIN K2-VITAMIN D3 PO), Take 1 tablet by mouth daily in the afternoon.  5000 international units  D3 plus k2, Disp: , Rfl:    omeprazole (PRILOSEC) 40 MG capsule, Take 1 capsule (40 mg total) by mouth 2 (two) times daily. (Patient not taking: Reported on 02/27/2023), Disp: 120 capsule, Rfl: 0   Vital signs in last 24 hrs: There were no vitals filed for this visit. Wt Readings from Last 3 Encounters:  02/27/23 133 lb 4 oz (60.4 kg)  11/29/21  141 lb (64 kg)  10/25/21 141 lb 3.2 oz (64 kg)    Physical Exam  General: well-appearing   CV: RRR, no JVD, no peripheral edema Resp: clear to auscultation bilaterally, normal RR and effort noted GI: soft, no tenderness, with active bowel sounds. No guarding or palpable organomegaly noted.   Labs:   ___________________________________________ Radiologic studies:   ____________________________________________ Other:   _____________________________________________ Assessment & Plan  Assessment:  Chronic constipation  History of gastric ulcer  Constipation  very well-controlled on current treatment.  She says it is a "miracle medication", and would like to continue taking it as long as it works.  Plan: -Continue Amitiza 8 mcg twice daily  -Discontinue Prilosec since her ulcer will have long since healed.  See me in 12 to 18 months, call sooner if needed.   - Amada Jupiter, MD    [facility]    Ladona Mow M Kadhim,acting as a scribe for Charlie Pitter III, MD.,have documented all relevant documentation on the behalf of Sherrilyn Rist, MD,as directed by  Sherrilyn Rist, MD while in the presence of Sherrilyn Rist, MD.   Marvis Repress III, MD, have reviewed all documentation for this visit. The documentation on 02/27/23 for the exam, diagnosis, procedures, and orders are all accurate and complete.

## 2023-05-20 DIAGNOSIS — I2584 Coronary atherosclerosis due to calcified coronary lesion: Secondary | ICD-10-CM | POA: Diagnosis not present

## 2023-05-20 DIAGNOSIS — E669 Obesity, unspecified: Secondary | ICD-10-CM | POA: Diagnosis not present

## 2023-05-20 DIAGNOSIS — F329 Major depressive disorder, single episode, unspecified: Secondary | ICD-10-CM | POA: Diagnosis not present

## 2023-05-20 DIAGNOSIS — R030 Elevated blood-pressure reading, without diagnosis of hypertension: Secondary | ICD-10-CM | POA: Diagnosis not present

## 2023-05-20 DIAGNOSIS — E119 Type 2 diabetes mellitus without complications: Secondary | ICD-10-CM | POA: Diagnosis not present

## 2023-05-20 DIAGNOSIS — M25561 Pain in right knee: Secondary | ICD-10-CM | POA: Diagnosis not present

## 2023-05-20 DIAGNOSIS — H01129 Discoid lupus erythematosus of unspecified eye, unspecified eyelid: Secondary | ICD-10-CM | POA: Diagnosis not present

## 2023-05-20 DIAGNOSIS — E039 Hypothyroidism, unspecified: Secondary | ICD-10-CM | POA: Diagnosis not present

## 2023-05-20 DIAGNOSIS — R739 Hyperglycemia, unspecified: Secondary | ICD-10-CM | POA: Diagnosis not present

## 2023-05-20 DIAGNOSIS — F17210 Nicotine dependence, cigarettes, uncomplicated: Secondary | ICD-10-CM | POA: Diagnosis not present

## 2023-05-20 DIAGNOSIS — R200 Anesthesia of skin: Secondary | ICD-10-CM | POA: Diagnosis not present

## 2023-05-29 DIAGNOSIS — M545 Low back pain, unspecified: Secondary | ICD-10-CM | POA: Diagnosis not present

## 2023-05-29 DIAGNOSIS — M25561 Pain in right knee: Secondary | ICD-10-CM | POA: Diagnosis not present

## 2023-05-31 ENCOUNTER — Encounter (HOSPITAL_COMMUNITY): Payer: Self-pay | Admitting: *Deleted

## 2023-05-31 ENCOUNTER — Emergency Department (HOSPITAL_COMMUNITY)
Admission: EM | Admit: 2023-05-31 | Discharge: 2023-05-31 | Disposition: A | Discharge status: Home / Self Care | Payer: 59 | Attending: Emergency Medicine | Admitting: Emergency Medicine

## 2023-05-31 ENCOUNTER — Emergency Department (HOSPITAL_COMMUNITY): Payer: 59

## 2023-05-31 ENCOUNTER — Other Ambulatory Visit: Payer: Self-pay

## 2023-05-31 DIAGNOSIS — M25561 Pain in right knee: Secondary | ICD-10-CM | POA: Diagnosis not present

## 2023-05-31 DIAGNOSIS — F17200 Nicotine dependence, unspecified, uncomplicated: Secondary | ICD-10-CM | POA: Diagnosis not present

## 2023-05-31 DIAGNOSIS — M79604 Pain in right leg: Secondary | ICD-10-CM | POA: Diagnosis not present

## 2023-05-31 MED ORDER — HYDROCODONE-ACETAMINOPHEN 5-325 MG PO TABS: ORAL_TABLET | ORAL | 0 refills | Status: AC

## 2023-05-31 NOTE — Discharge Instructions (Signed)
The ultrasound of your leg did not show evidence of a blood clot.  I recommend that you continue taking your prednisone as directed.  Follow up with your orthopedic provider next week for recheck

## 2023-05-31 NOTE — ED Provider Notes (Signed)
[place] EMERGENCY DEPARTMENT AT [facility] Provider Note   CSN: [id number] Arrival date & time: 05/31/23  0957     History  Chief Complaint  Patient presents with   Knee Pain    Angel Young is a 57 y.o. female.   Knee Pain Associated symptoms: no back pain, no fever and no neck pain        Angel Young is a 57 y.o. female who presents to the Emergency Department complaining of right knee pain and numbness of the lateral right thigh x one month.  She was seen at [facility] earlier this week and reports multiple XR's of her knee and lower back.  She states that she was advised that her symptoms are coming from her back and not her knee.  She feels that her right knee is swollen and pain radiates to the back of her knee and slightly into her upper calf.  She is concerned that she may have a blood clot.  States she is taking gabapentin and steroid without relief.  Here for second opinion as she questions her knee pain coming lower back.  Denies recent injury.  she is a smoker, no history of previous blood clots.  Denies chest pain, shortness of breath, redness or swelling of her lower leg.  She also denies abdominal pain, urine or bowel changes.  No numbness of her lower leg or foot currently, but she has had it previously.    Home Medications Prior to Admission medications   Medication Sig Start Date End Date Taking? Authorizing Provider  Coenzyme Q10 (CO Q-10) 100 MG CAPS Take by mouth daily.    [provider]  Cyanocobalamin (VITAMIN B 12 PO) Take 1 tablet by mouth daily.    [provider]  Doxepin HCl 3 MG TABS Take one po bid 12/19/21   Sheffield, Harvin Hazel R, PA-C  DULoxetine (CYMBALTA) 30 MG capsule Take 30 mg by mouth daily. 10/08/21   [provider]  levothyroxine (SYNTHROID) 100 MCG tablet Take 100 mcg by mouth daily. 12/03/21   [provider]  lubiprostone (AMITIZA) 8 MCG capsule TAKE 1  CAPSULE BY MOUTH TWICE A DAY WITH MEALS 02/20/23   Sherrilyn Rist, MD  Melatonin 3 MG CAPS Take 3 mg by mouth at bedtime.    [provider]  Moringa Oleifera (MORINGA PO) Take 1 capsule by mouth 2 (two) times daily. Pure Vida    [provider]  omeprazole (PRILOSEC) 40 MG capsule Take 1 capsule (40 mg total) by mouth 2 (two) times daily. Patient not taking: Reported on 02/27/2023 11/29/21 01/28/22  Sherrilyn Rist, MD  simvastatin (ZOCOR) 80 MG tablet Take 80 mg by mouth daily.    [provider]  Vitamin D-Vitamin K (VITAMIN K2-VITAMIN D3 PO) Take 1 tablet by mouth daily in the afternoon.  5000 international units  D3 plus k2    [provider]      Allergies    Patient has no known allergies.    Review of Systems   Review of Systems  Constitutional:  Negative for chills and fever.  Respiratory:  Negative for shortness of breath.   Cardiovascular:  Negative for chest pain.  Gastrointestinal:  Negative for abdominal pain, constipation, diarrhea, nausea and vomiting.  Genitourinary:  Negative for difficulty urinating, dysuria and flank pain.  Musculoskeletal:  Negative for back pain and neck pain.  Skin:  Negative for color change and  rash.  Neurological:  Positive for numbness (numbness localized to the lateral right thigh). Negative for dizziness, weakness and headaches.    Physical Exam Updated Vital Signs BP (!) 185/93 (BP Location: Right Arm)   Pulse 72   Temp 97.9 F (36.6 C) (Oral)   Resp 16   Ht 4\' 9"  (1.448 m)   Wt 57.6 kg   SpO2 96%   BMI 27.48 kg/m  Physical Exam Vitals and nursing note reviewed.  Constitutional:      General: She is not in acute distress.    Appearance: Normal appearance. She is not ill-appearing or toxic-appearing.  Cardiovascular:     Rate and Rhythm: Normal rate and regular rhythm.     Pulses: Normal pulses.  Pulmonary:     Effort: Pulmonary effort is normal.  Chest:     Chest wall: No  tenderness.  Abdominal:     Palpations: Abdomen is soft.     Tenderness: There is no abdominal tenderness.  Musculoskeletal:        General: Normal range of motion.     Right knee: No swelling, effusion, bony tenderness or crepitus. Normal range of motion. No tenderness. Normal alignment and normal meniscus.     Instability Tests: Anterior Lachman test negative. Medial McMurray test negative and lateral McMurray test negative.     Right lower leg: No swelling.     Comments: Pt has FROM of the right hip and knee.  I do not appreciate any effusion or edema of the knee.  No excessive warmth or erythema.  No joint instability on exam. No midline tenderness of L spine  Skin:    General: Skin is warm.     Capillary Refill: Capillary refill takes less than 2 seconds.  Neurological:     General: No focal deficit present.     Mental Status: She is alert.     Sensory: No sensory deficit.     Motor: No weakness.     ED Results / Procedures / Treatments   Labs (all labs ordered are listed, but only abnormal results are displayed) Labs Reviewed - No data to display  EKG None  Radiology US Venous Img Lower Unilateral Right  Result Date: 05/31/2023 CLINICAL DATA:  Right lower extremity pain EXAM: RIGHT LOWER EXTREMITY VENOUS DOPPLER ULTRASOUND TECHNIQUE: Gray-scale sonography with compression, as well as color and duplex ultrasound, were performed to evaluate the deep venous system(s) from the level of the common femoral vein through the popliteal and proximal calf veins. COMPARISON:  None Available. FINDINGS: VENOUS Normal compressibility of the common femoral, superficial femoral, and popliteal veins, as well as the visualized calf veins. Visualized portions of profunda femoral vein and great saphenous vein unremarkable. No filling defects to suggest DVT on grayscale or color Doppler imaging. Doppler waveforms show normal direction of venous flow, normal respiratory plasticity and response to  augmentation. Limited views of the contralateral common femoral vein are unremarkable. OTHER None. Limitations: none IMPRESSION: Negative. Electronically Signed   By: Duanne Guess D.O.   On: 05/31/2023 12:28    Procedures Procedures    Medications Ordered in ED Medications - No data to display  ED Course/ Medical Decision Making/ A&P                                 Medical Decision Making Patient here for evaluation of right knee pain.  Symptoms have been present for some time.  She is currently followed by orthopedics for this and was seen few days ago.  Taking gabapentin and steroids without relief.  No known injury and knee pain has been present for some time.  Also endorses numbness localized to lateral right thigh.  No urine or bowel changes, abdominal pain, flank pain or saddle anesthesias.   Exam seems consistent with sciatica pain.  I suspect her symptoms are originating from her lower back.  I do not appreciate any acute findings on her knee exam.  She endorses recent evaluation by orthopedics with multiple x-rays of her back and knee performed earlier this week.  I am unable to find any recent x-rays in her medical record.  She is requesting evaluation for possible blood clot.  Clinically I have a low suspicion but she does have some risk factors so we will proceed with ultrasound.  No reported trauma or new symptoms to suggest need for repeat imaging of the knee.  No red flags on exam to suggest a cauda equina.  She is well-appearing and septic joint, spinal abscess felt less likely  Amount and/or Complexity of Data Reviewed Radiology: ordered.    Details: Venous ultrasound of the right lower extremity without evidence of DVT Discussion of management or test interpretation with external provider(s): Discussed ultrasound findings with the patient.  No clinical findings suggestive of septic joint or cauda equina.  Patient is ambulatory in the department with steady gait.  I will  provide short course of pain medication for symptomatic relief, I recommend that she continue her steroid as directed and she will follow-up with orthopedics.  Risk Prescription drug management.            Final Clinical Impression(s) / ED Diagnoses Final diagnoses:  Acute pain of right knee    Rx / DC Orders ED Discharge Orders     None         Pauline Aus, PA-C 06/01/23 1610    Vanetta Mulders, MD 06/08/23 1538

## 2023-05-31 NOTE — ED Triage Notes (Signed)
Pt c/o worsening right knee pain x 1 month. Pt show [facility] on Tuesday and they did xrays on her knee and per pt said her knee looked good and they were going to treat her for back pain. Pt's right knee is swollen and has pain around to the back of her knee. Pt concerned she may have a blood clot.

## 2023-05-31 NOTE — ED Provider Notes (Signed)
  [place] EMERGENCY DEPARTMENT AT [facility] Provider Note   CSN: [id number] Arrival date & time: 05/31/23  0957     History  Chief Complaint  Patient presents with   Knee Pain    Angel Young Ralph Leyden is a 57 y.o. female.  This is a duplicate chart.  Patient seen by physician assistant Triplett.  I did not see the patient.       Home Medications Prior to Admission medications   Medication Sig Start Date End Date Taking? Authorizing Provider  Coenzyme Q10 (CO Q-10) 100 MG CAPS Take by mouth daily.    [provider]  Cyanocobalamin (VITAMIN B 12 PO) Take 1 tablet by mouth daily.    [provider]  Doxepin HCl 3 MG TABS Take one po bid 12/19/21   Sheffield, Harvin Hazel R, PA-C  DULoxetine (CYMBALTA) 30 MG capsule Take 30 mg by mouth daily. 10/08/21   [provider]  levothyroxine (SYNTHROID) 100 MCG tablet Take 100 mcg by mouth daily. 12/03/21   [provider]  lubiprostone (AMITIZA) 8 MCG capsule TAKE 1 CAPSULE BY MOUTH TWICE A DAY WITH MEALS 02/20/23   Sherrilyn Rist, MD  Melatonin 3 MG CAPS Take 3 mg by mouth at bedtime.    [provider]  Moringa Oleifera (MORINGA PO) Take 1 capsule by mouth 2 (two) times daily. Pure Vida    [provider]  omeprazole (PRILOSEC) 40 MG capsule Take 1 capsule (40 mg total) by mouth 2 (two) times daily. Patient not taking: Reported on 02/27/2023 11/29/21 01/28/22  Sherrilyn Rist, MD  simvastatin (ZOCOR) 80 MG tablet Take 80 mg by mouth daily.    [provider]  Vitamin D-Vitamin K (VITAMIN K2-VITAMIN D3 PO) Take 1 tablet by mouth daily in the afternoon.  5000 international units  D3 plus k2    [provider]      Allergies    Patient has no known allergies.    Review of Systems   Review of Systems  Physical Exam Updated Vital Signs BP (!) 185/93 (BP Location: Right Arm)   Pulse 72   Temp 97.9 F (36.6 C) (Oral)   Resp 16   Ht 1.448  m (4\' 9" )   Wt 57.6 kg   SpO2 96%   BMI 27.48 kg/m  Physical Exam  ED Results / Procedures / Treatments   Labs (all labs ordered are listed, but only abnormal results are displayed) Labs Reviewed - No data to display  EKG None  Radiology No results found.  Procedures Procedures    Medications Ordered in ED Medications - No data to display  ED Course/ Medical Decision Making/ A&P                                 Medical Decision Making Risk Prescription drug management.   Duplicate chart.  Patient actually seen by the physician assistant. Final Clinical Impression(s) / ED Diagnoses Final diagnoses:  None    Rx / DC Orders ED Discharge Orders     None         Vanetta Mulders, MD 06/08/23 1538

## 2023-08-07 ENCOUNTER — Other Ambulatory Visit: Payer: Self-pay | Admitting: Gastroenterology

## 2024-06-09 ENCOUNTER — Other Ambulatory Visit: Payer: Self-pay | Admitting: Gastroenterology
